# Patient Record
Sex: Male | Born: 1955 | Race: White | Hispanic: No | Marital: Married | Smoking: Never smoker
Health system: Southern US, Community
[De-identification: ages and names within clinical notes are randomized; demographics above are authoritative.]

## PROBLEM LIST (undated history)

## (undated) DIAGNOSIS — M109 Gout, unspecified: Secondary | ICD-10-CM

## (undated) DIAGNOSIS — N183 Chronic kidney disease, stage 3 unspecified: Secondary | ICD-10-CM

## (undated) DIAGNOSIS — K4090 Unilateral inguinal hernia, without obstruction or gangrene, not specified as recurrent: Secondary | ICD-10-CM

## (undated) DIAGNOSIS — F419 Anxiety disorder, unspecified: Secondary | ICD-10-CM

## (undated) DIAGNOSIS — D709 Neutropenia, unspecified: Secondary | ICD-10-CM

## (undated) DIAGNOSIS — D751 Secondary polycythemia: Secondary | ICD-10-CM

## (undated) DIAGNOSIS — C719 Malignant neoplasm of brain, unspecified: Secondary | ICD-10-CM

## (undated) DIAGNOSIS — M1990 Unspecified osteoarthritis, unspecified site: Secondary | ICD-10-CM

## (undated) DIAGNOSIS — G4730 Sleep apnea, unspecified: Secondary | ICD-10-CM

## (undated) DIAGNOSIS — N200 Calculus of kidney: Secondary | ICD-10-CM

## (undated) DIAGNOSIS — Z961 Presence of intraocular lens: Secondary | ICD-10-CM

## (undated) DIAGNOSIS — E663 Overweight: Secondary | ICD-10-CM

## (undated) DIAGNOSIS — I10 Essential (primary) hypertension: Secondary | ICD-10-CM

## (undated) DIAGNOSIS — E786 Lipoprotein deficiency: Secondary | ICD-10-CM

## (undated) DIAGNOSIS — C801 Malignant (primary) neoplasm, unspecified: Secondary | ICD-10-CM

## (undated) DIAGNOSIS — N529 Male erectile dysfunction, unspecified: Secondary | ICD-10-CM

## (undated) DIAGNOSIS — G8929 Other chronic pain: Secondary | ICD-10-CM

## (undated) DIAGNOSIS — Z87442 Personal history of urinary calculi: Secondary | ICD-10-CM

## (undated) DIAGNOSIS — G4700 Insomnia, unspecified: Secondary | ICD-10-CM

## (undated) DIAGNOSIS — T8859XA Other complications of anesthesia, initial encounter: Secondary | ICD-10-CM

## (undated) HISTORY — PX: BRAIN SURGERY: SHX531

## (undated) HISTORY — DX: Chronic kidney disease, stage 3 unspecified: N18.30

## (undated) HISTORY — PX: LITHOTRIPSY: SUR834

## (undated) HISTORY — DX: Insomnia, unspecified: G47.00

## (undated) HISTORY — PX: EYE SURGERY: SHX253

## (undated) HISTORY — DX: Other chronic pain: G89.29

## (undated) HISTORY — PX: COLONOSCOPY: SHX174

## (undated) HISTORY — DX: Essential (primary) hypertension: I10

## (undated) HISTORY — DX: Sleep apnea, unspecified: G47.30

## (undated) HISTORY — DX: Calculus of kidney: N20.0

## (undated) HISTORY — DX: Gout, unspecified: M10.9

## (undated) HISTORY — DX: Overweight: E66.3

## (undated) HISTORY — PX: OTHER SURGICAL HISTORY: SHX169

## (undated) HISTORY — DX: Lipoprotein deficiency: E78.6

## (undated) HISTORY — PX: HERNIA REPAIR: SHX51

## (undated) HISTORY — DX: Male erectile dysfunction, unspecified: N52.9

## (undated) HISTORY — DX: Unilateral inguinal hernia, without obstruction or gangrene, not specified as recurrent: K40.90

---

## 2005-03-19 ENCOUNTER — Ambulatory Visit: Payer: Self-pay | Admitting: Unknown Physician Specialty

## 2005-05-31 ENCOUNTER — Ambulatory Visit: Payer: Self-pay | Admitting: General Practice

## 2005-05-31 IMAGING — CR DG LUMBAR SPINE AP/LAT/OBLIQUES W/ FLEX AND EXT
1 series · 6 of 6 positions shown · non-contrast
Comparison: none

REASON FOR EXAM: Back pain (fax results to Dr.[email or web address] [[email or web address]])
COMMENTS:

PROCEDURE:     DXR - DXR LUMBAR SPINE WITH OBLIQUES  - [DATE]  [DATE]
RESULT:       AP, lateral and oblique views of the lumbar spine show the
vertebral body heights and intervertebral disc spaces to be well maintained.
 The vertebral body alignment is normal.  The pedicles are bilaterally
intact.

[Series 1: view not recorded · 0.17mm/px · 6 of 6 slices shown]
[im 1/6]
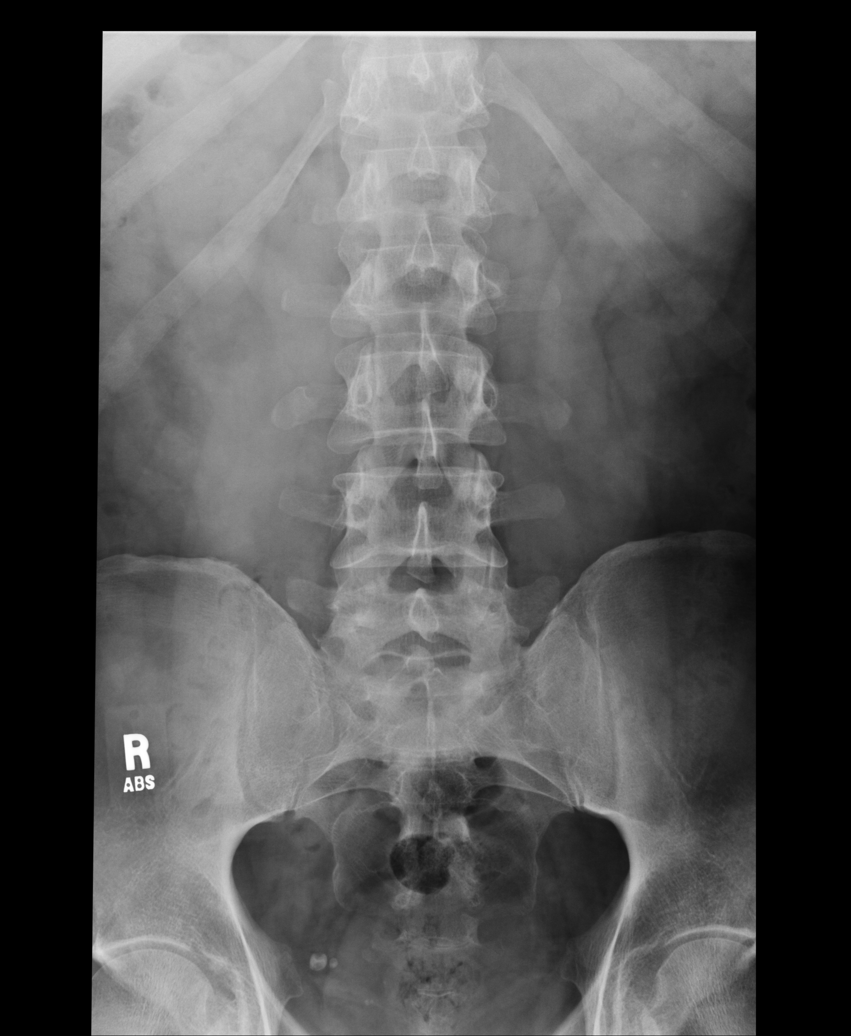
[im 2/6]
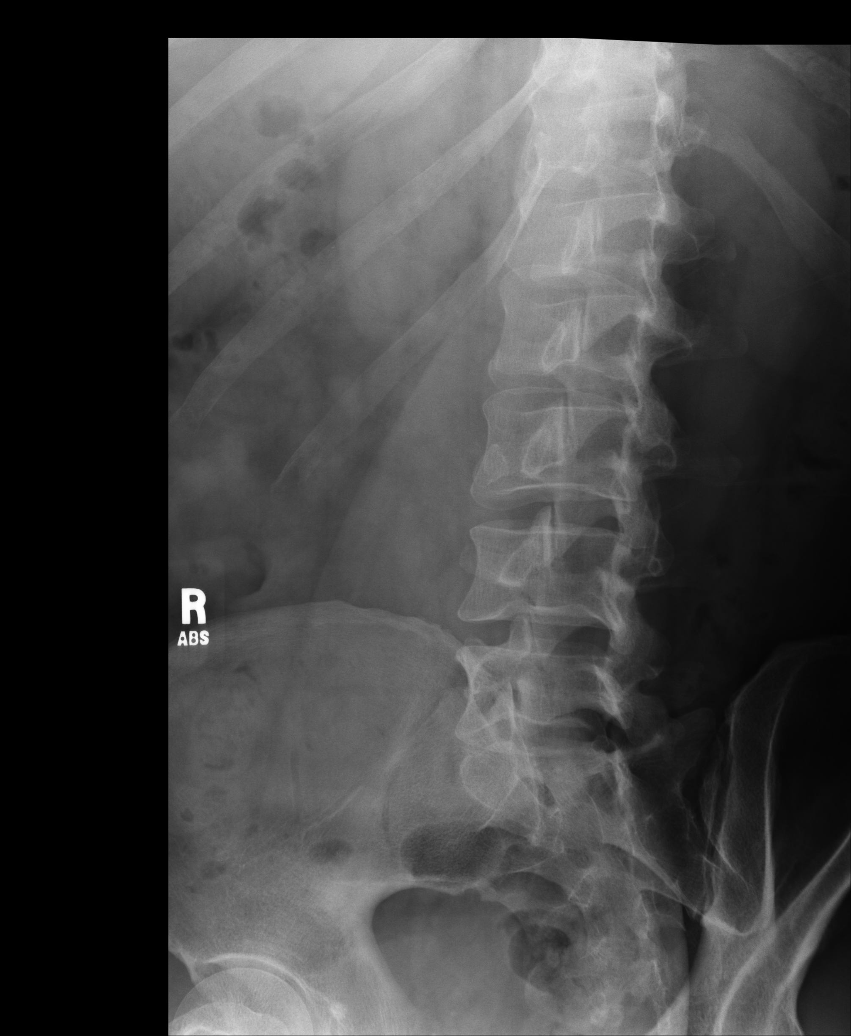
[im 3/6]
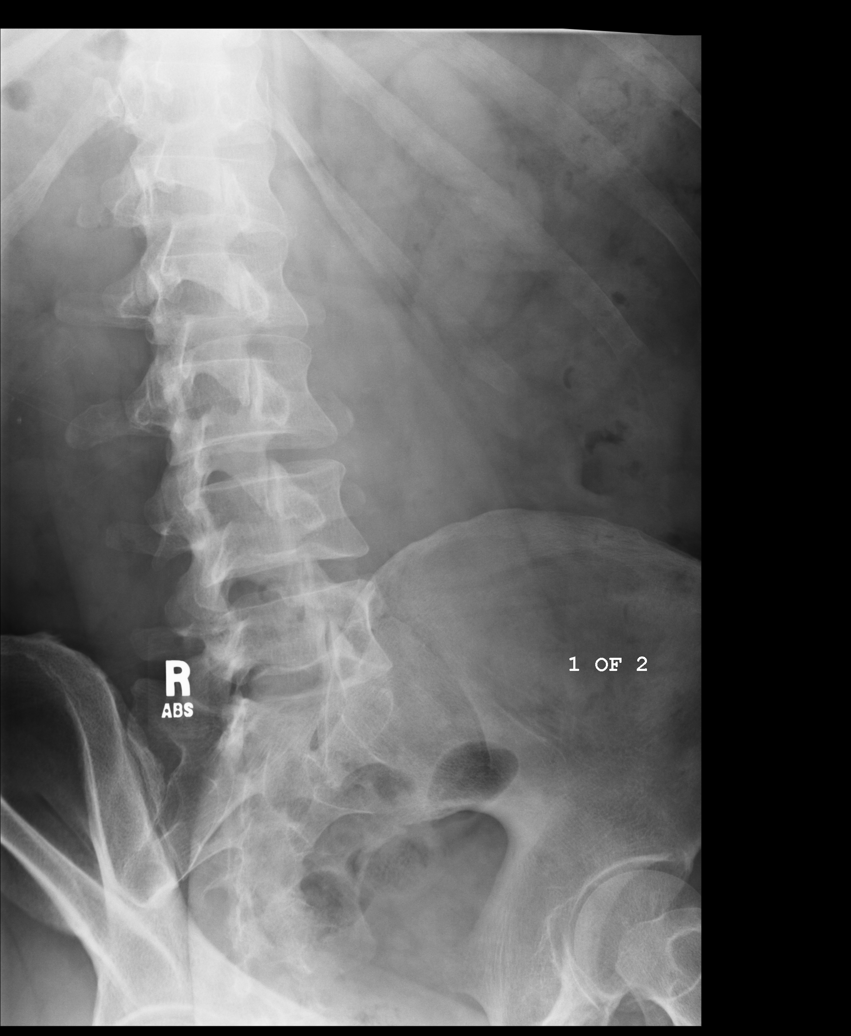
[im 4/6]
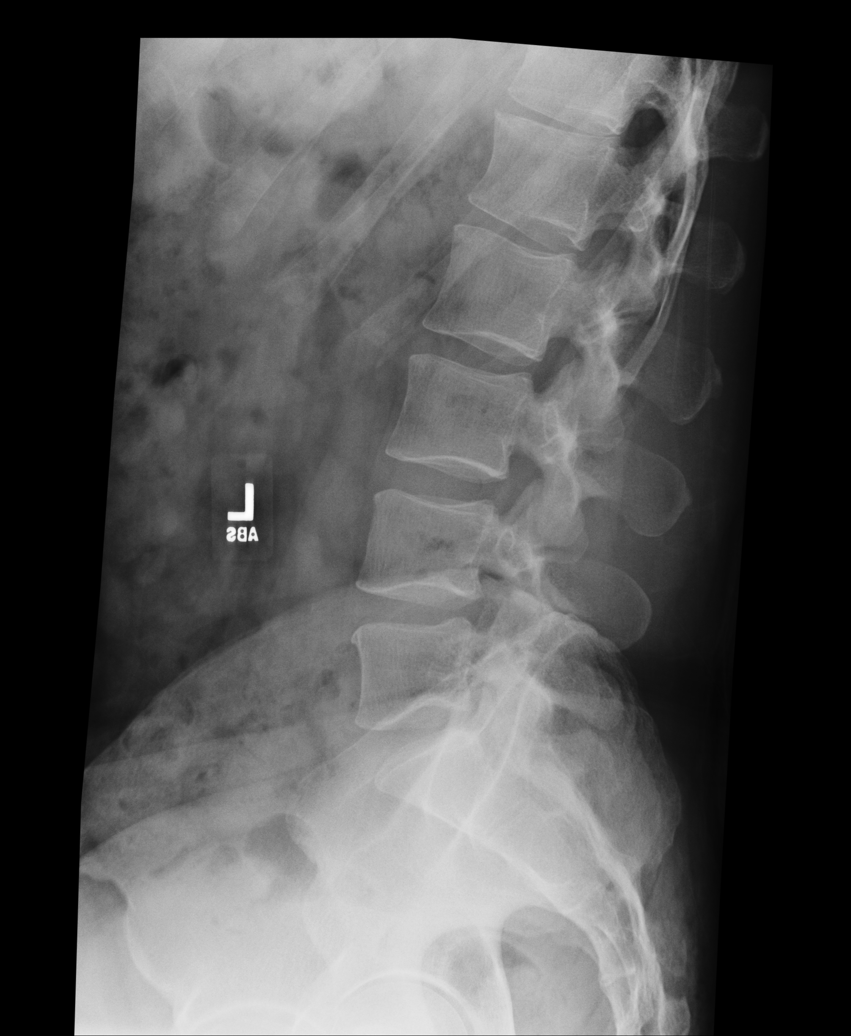
[im 5/6]
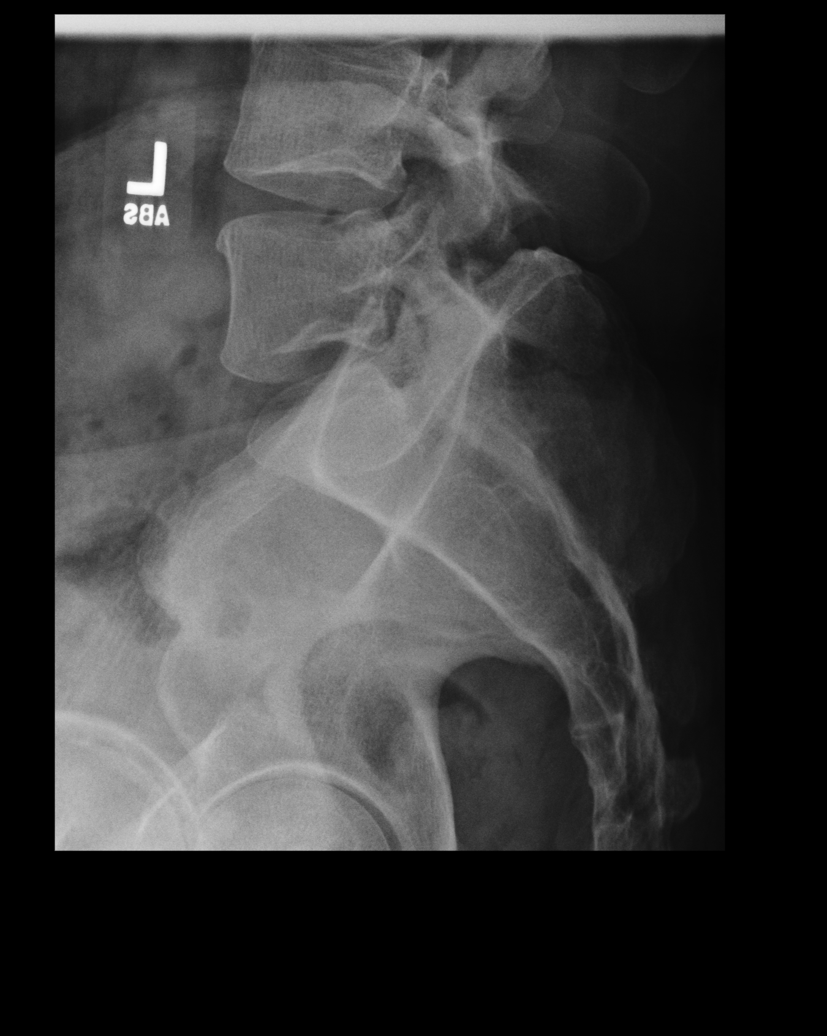
[im 6/6]
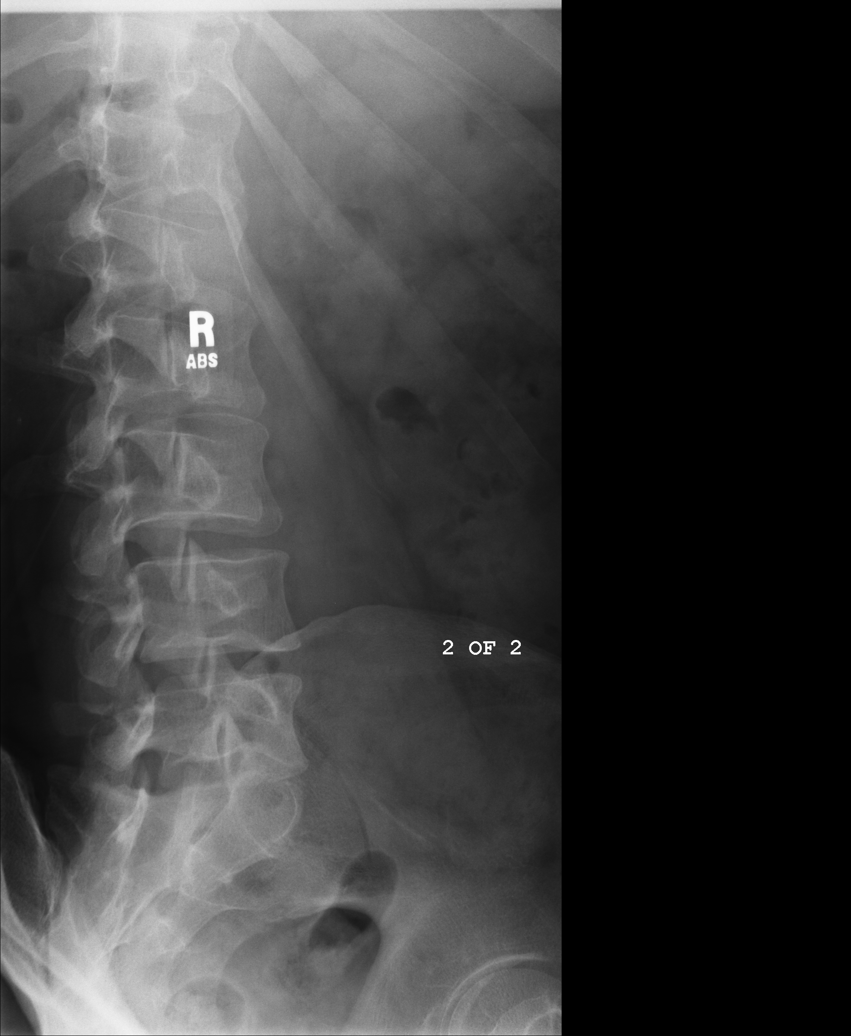

[6 of 6 positions shown; findings below may reference images not displayed]

IMPRESSION: No significant abnormalities are noted.

## 2006-12-12 ENCOUNTER — Ambulatory Visit: Payer: Self-pay | Admitting: Unknown Physician Specialty

## 2012-05-01 ENCOUNTER — Ambulatory Visit: Payer: Self-pay | Admitting: Unknown Physician Specialty

## 2012-10-03 DIAGNOSIS — Z87442 Personal history of urinary calculi: Secondary | ICD-10-CM | POA: Insufficient documentation

## 2012-10-03 DIAGNOSIS — K4090 Unilateral inguinal hernia, without obstruction or gangrene, not specified as recurrent: Secondary | ICD-10-CM | POA: Insufficient documentation

## 2013-05-24 ENCOUNTER — Ambulatory Visit: Payer: Self-pay | Admitting: General Surgery

## 2013-06-14 ENCOUNTER — Ambulatory Visit: Payer: Self-pay | Admitting: Surgery

## 2014-06-08 NOTE — Discharge Summary (Signed)
PATIENT NAME:  Frank, Johnson MR#:  [id number] DATE OF BIRTH:  [birth year]  DATE OF ADMISSION:  06/14/2013 DATE OF DISCHARGE:  06/15/2013  BRIEF HISTORY:  Frank Johnson is a 59 year old gentleman admitted to the hospital with the plan of pursuing an elective robotic-assisted laparoscopic left inguinal hernia. The procedure was performed without difficulty. No significant intraoperative problems. He has severe sleep apnea, so we elected to observe him for several hours following surgery on a phase 3 admission. However, later in the evening, he still had continued abdominal discomfort, mild nausea, and significant sedation. We elected to observe him over the course of the evening to avoid any postoperative problems related to sleep apnea. This morning, he is up, active, tolerating a diet with no complaints. His wounds look good. There is no sign of any infection. He is discharged home today to be followed in the office in 7 to 10 days' time.   DISCHARGE MEDICATIONS:  Include Ambien 10 mg p.o. at bedtime p.r.n., aspirin 81 mg b.i.d., Xanax 1 mg at bedtime p.r.n., indomethacin 25 mg t.i.d. p.r.n., allopurinol 300 mg once a day, meloxicam 15 mg once a day, Percocet 5/325 every 6 hours p.r.n., lisinopril 20 mg p.o. q. day.   FINAL DISCHARGE DIAGNOSIS:  Left inguinal hernia.   SURGERY:  Robotic-assisted laparoscopic left inguinal hernia repair.   ____________________________ Micheline Maze, MD rle:ms D: 06/15/2013 21:14:51 ET T: 06/15/2013 21:42:21 ET JOB#: [id number]  cc: Micheline Maze, MD, <Dictator> Nelda Severe. Burt Ek, [place] MD ELECTRONICALLY SIGNED 06/16/2013 20:46

## 2014-06-08 NOTE — Op Note (Signed)
PATIENT NAME:  Frank Johnson, Frank Johnson MR#:  [id number] DATE OF BIRTH:  Nov 01, 1955  DATE OF PROCEDURE:  06/14/2013  PREOPERATIVE DIAGNOSIS:  Left inguinal hernia.   POSTOPERATIVE DIAGNOSIS:  Left inguinal hernia.  OPERATION PERFORMED:  Robotic-assisted laparoscopic left inguinal hernia repair.   ANESTHESIA:  General.   SURGEON:  Rodena Goldmann, M.D.   PROCEDURE IN DETAIL:  With the patient in the supine position after the induction of appropriate general anesthesia, the patient was prepped with ChloraPrep and draped with sterile towels.  Foley catheter previously had been placed/  The patient was placed in the head down, feet up position with a fairly steep Trendelenburg. The patient was secured to the table appropriately. Supraumbilical incision was made in the standard fashion and carried down bluntly through the subcutaneous tissue. A Veress needle was used to cannulate the peritoneal cavity. CO2 was insufflated to appropriate pressure measurements. When approximately 2.5 liters of CO2 were instilled an 8.5 mm robotic port was inserted into the peritoneal cavity. Intraperitoneal position was confirmed. CO2 was reinsufflated. Two lateral ports, 8.5 mm in size, were inserted under direct vision. The assistant port, 10 mm in size, inserted into the left upper quadrant under direct vision. The robot was brought to the table, docked to the patient, instruments inserted under direct vision and advanced to the site of the hernia. I then moved to the console. The hernia appeared to be quite large and an indirect defect. The peritoneum was taken down as the colon was adherent to the sac. The sigmoid colon was dissected off the hernia defect and then dissection carried along above the defect laterally from the lateral umbilical fold. The peritoneum was taken down and the sac was dissected free from the cord structures. The sac was quite large and tedious dissection was required to expose it and reduce it into the  abdominal cavity. Cooper's ligament was then identified.  Bard 3DMax mesh was brought to the table, inserted into the preperitoneal space, sutured in place using 3-0 Vicryl. The repair appeared to be satisfactory. The peritoneum was then closed over the mesh to separate from the bowel using interlocking V-Loc suture. Repair appeared to be satisfactory. The instruments were removed, robot undocked. The abdomen was desufflated. Skin incisions were closed with 5-0 nylon. The area was infiltrated with 0.25% Marcaine for postoperative pain control. Sterile dressings were applied. The patient was returned to the recovery room, having tolerated the procedure well. Sponge, instrument and needle counts were correct x 2 in the operating room.     ____________________________ Rodena Goldmann III, MD rle:dmm D: 06/14/2013 14:00:31 ET T: 06/14/2013 20:09:24 ET JOB#: [id number]  cc: Micheline Maze, MD, <Dictator> Nelda Severe. Burt Ek, [place] MD ELECTRONICALLY SIGNED 06/15/2013 21:16

## 2014-10-17 ENCOUNTER — Other Ambulatory Visit: Payer: Self-pay | Admitting: Physician Assistant

## 2014-10-31 ENCOUNTER — Other Ambulatory Visit: Payer: Self-pay | Admitting: Physician Assistant

## 2014-11-16 ENCOUNTER — Other Ambulatory Visit: Payer: Self-pay | Admitting: Physician Assistant

## 2014-12-16 ENCOUNTER — Other Ambulatory Visit: Payer: Self-pay | Admitting: Physician Assistant

## 2015-02-14 ENCOUNTER — Other Ambulatory Visit: Payer: Self-pay | Admitting: Physician Assistant

## 2015-04-21 ENCOUNTER — Other Ambulatory Visit: Payer: Self-pay | Admitting: Physician Assistant

## 2015-05-19 ENCOUNTER — Other Ambulatory Visit: Payer: Self-pay | Admitting: Physician Assistant

## 2018-02-15 HISTORY — PX: RETINAL DETACHMENT SURGERY: SHX105

## 2018-03-02 ENCOUNTER — Other Ambulatory Visit: Payer: Self-pay | Admitting: Family Medicine

## 2018-04-03 ENCOUNTER — Other Ambulatory Visit: Payer: Self-pay | Admitting: Family Medicine

## 2018-04-03 NOTE — Telephone Encounter (Signed)
Not a patient of ours.

## 2018-08-31 ENCOUNTER — Other Ambulatory Visit: Payer: Self-pay | Admitting: Internal Medicine

## 2018-09-19 ENCOUNTER — Other Ambulatory Visit: Payer: Self-pay

## 2018-09-19 MED ORDER — LISINOPRIL 20 MG PO TABS: 20.0000 mg | ORAL_TABLET | Freq: Every day | ORAL | 3 refills | Status: DC

## 2018-11-16 ENCOUNTER — Ambulatory Visit: Payer: 59

## 2018-11-16 DIAGNOSIS — Z23 Encounter for immunization: Secondary | ICD-10-CM

## 2018-11-27 ENCOUNTER — Other Ambulatory Visit: Payer: Self-pay | Admitting: Internal Medicine

## 2018-11-29 ENCOUNTER — Other Ambulatory Visit: Payer: Self-pay | Admitting: Registered Nurse

## 2018-11-29 ENCOUNTER — Encounter: Payer: Self-pay | Admitting: Registered Nurse

## 2018-11-29 NOTE — Telephone Encounter (Signed)
Patient last telephone visit with Dr Roxan Hockey for anxiety/insomnia 05/23/2018.  And last refill Jun 2020.  Per Dr Hendrickson's/[place] policy patient to have face to face visits every 90 days for controlled substances.  I last saw patient Feb 02 2018.   Last labs Jul 2019.   Reviewed [place] PMP website 46 Rx all from [facility].  Last ambien XR fill 10/30/2018 Dr Roxan Hockey.  Patient needs appt.  Bridge refill today.

## 2018-12-27 ENCOUNTER — Encounter: Payer: Self-pay | Admitting: Registered Nurse

## 2018-12-27 ENCOUNTER — Telehealth: Payer: Self-pay | Admitting: Registered Nurse

## 2018-12-27 MED ORDER — ZOLPIDEM TARTRATE ER 12.5 MG PO TBCR: EXTENDED_RELEASE_TABLET | ORAL | 0 refills | Status: DC

## 2018-12-27 NOTE — Telephone Encounter (Signed)
Bridge refill given for zolpidem tartrate ER 12.5mg  (1/2-1 tab) po qhs prn insomnia #30 RF0 on 29 Nov 2018 and patient to schedule follow up visit as controlled substances require face to face visit every 3 months per Dr Roxan Hockey.  None noted in Epic since last telephone visit 05/23/2018.  Received request today for another medication refill.  [place] PMP website reviewed last fill 11/29/2018.  All Rx from [facility].  Jess contacting patient to schedule appt.  Earliest available 01/18/2019 with PA Truman Hayward.  Bridge refill entered for patient.

## 2019-01-01 DIAGNOSIS — H524 Presbyopia: Secondary | ICD-10-CM | POA: Diagnosis not present

## 2019-01-01 DIAGNOSIS — H3322 Serous retinal detachment, left eye: Secondary | ICD-10-CM | POA: Diagnosis not present

## 2019-01-03 DIAGNOSIS — H33022 Retinal detachment with multiple breaks, left eye: Secondary | ICD-10-CM | POA: Diagnosis not present

## 2019-01-10 ENCOUNTER — Other Ambulatory Visit: Payer: Self-pay

## 2019-01-10 DIAGNOSIS — Z0000 Encounter for general adult medical examination without abnormal findings: Secondary | ICD-10-CM

## 2019-01-10 LAB — POCT URINALYSIS DIPSTICK
Bilirubin, UA: NEGATIVE
Blood, UA: NEGATIVE
Glucose, UA: NEGATIVE
Ketones, UA: NEGATIVE
Leukocytes, UA: NEGATIVE
Nitrite, UA: NEGATIVE
Protein, UA: NEGATIVE
Spec Grav, UA: 1.025 (ref 1.010–1.025)
Urobilinogen, UA: 0.2 E.U./dL
pH, UA: 5.5 (ref 5.0–8.0)

## 2019-01-11 LAB — CMP12+LP+TP+TSH+6AC+PSA+CBC…
ALT: 55 IU/L — ABNORMAL HIGH (ref 0–44)
AST: 23 IU/L (ref 0–40)
Albumin/Globulin Ratio: 1.9 (ref 1.2–2.2)
Albumin: 4.8 g/dL (ref 3.8–4.8)
Alkaline Phosphatase: 103 IU/L (ref 39–117)
BUN/Creatinine Ratio: 22 (ref 10–24)
BUN: 30 mg/dL — ABNORMAL HIGH (ref 8–27)
Basophils Absolute: 0.2 10*3/uL (ref 0.0–0.2)
Basos: 2 %
Bilirubin Total: 1.3 mg/dL — ABNORMAL HIGH (ref 0.0–1.2)
Calcium: 10.4 mg/dL — ABNORMAL HIGH (ref 8.6–10.2)
Chloride: 103 mmol/L (ref 96–106)
Chol/HDL Ratio: 5.9 ratio — ABNORMAL HIGH (ref 0.0–5.0)
Cholesterol, Total: 202 mg/dL — ABNORMAL HIGH (ref 100–199)
Creatinine, Ser: 1.39 mg/dL — ABNORMAL HIGH (ref 0.76–1.27)
EOS (ABSOLUTE): 0.1 10*3/uL (ref 0.0–0.4)
Eos: 1 %
Estimated CHD Risk: 1.3 times avg. — ABNORMAL HIGH (ref 0.0–1.0)
Free Thyroxine Index: 2.1 (ref 1.2–4.9)
GFR calc Af Amer: 62 mL/min/{1.73_m2} (ref >59)
GFR calc non Af Amer: 54 mL/min/{1.73_m2} — ABNORMAL LOW (ref >59)
GGT: 24 IU/L (ref 0–65)
Globulin, Total: 2.5 g/dL (ref 1.5–4.5)
Glucose: 92 mg/dL (ref 65–99)
HDL: 34 mg/dL — ABNORMAL LOW (ref >39)
Hematocrit: 59.3 % — ABNORMAL HIGH (ref 37.5–51.0)
Hemoglobin: 20.3 g/dL (ref 13.0–17.7)
Immature Grans (Abs): 0.7 10*3/uL — ABNORMAL HIGH (ref 0.0–0.1)
Immature Granulocytes: 5 %
Iron: 144 ug/dL (ref 38–169)
LDH: 147 IU/L (ref 121–224)
LDL Chol Calc (NIH): 140 mg/dL — ABNORMAL HIGH (ref 0–99)
Lymphocytes Absolute: 3.5 10*3/uL — ABNORMAL HIGH (ref 0.7–3.1)
Lymphs: 26 %
MCH: 30 pg (ref 26.6–33.0)
MCHC: 34.2 g/dL (ref 31.5–35.7)
MCV: 88 fL (ref 79–97)
Monocytes Absolute: 1 10*3/uL — ABNORMAL HIGH (ref 0.1–0.9)
Monocytes: 8 %
Neutrophils Absolute: 7.8 10*3/uL — ABNORMAL HIGH (ref 1.4–7.0)
Neutrophils: 58 %
Phosphorus: 3.7 mg/dL (ref 2.8–4.1)
Platelets: 269 10*3/uL (ref 150–450)
Potassium: 4.5 mmol/L (ref 3.5–5.2)
Prostate Specific Ag, Serum: 2 ng/mL (ref 0.0–4.0)
RBC: 6.76 x10E6/uL — ABNORMAL HIGH (ref 4.14–5.80)
RDW: 13.5 % (ref 11.6–15.4)
Sodium: 140 mmol/L (ref 134–144)
T3 Uptake Ratio: 27 % (ref 24–39)
T4, Total: 7.7 ug/dL (ref 4.5–12.0)
TSH: 2.11 u[IU]/mL (ref 0.450–4.500)
Total Protein: 7.3 g/dL (ref 6.0–8.5)
Triglycerides: 154 mg/dL — ABNORMAL HIGH (ref 0–149)
Uric Acid: 7.1 mg/dL (ref 3.7–8.6)
VLDL Cholesterol Cal: 28 mg/dL (ref 5–40)
WBC: 13.3 10*3/uL — ABNORMAL HIGH (ref 3.4–10.8)

## 2019-01-18 ENCOUNTER — Other Ambulatory Visit: Payer: Self-pay

## 2019-01-18 ENCOUNTER — Encounter: Payer: Self-pay | Admitting: Physician Assistant

## 2019-01-18 ENCOUNTER — Ambulatory Visit: Payer: Self-pay | Admitting: Physician Assistant

## 2019-01-18 VITALS — BP 110/68 | HR 89 | Temp 98.0°F | Resp 16 | Ht 72.0 in | Wt 231.0 lb

## 2019-01-18 DIAGNOSIS — Z0000 Encounter for general adult medical examination without abnormal findings: Secondary | ICD-10-CM

## 2019-01-18 NOTE — Progress Notes (Signed)
   Subjective: Annual physical    Patient ID: Male Meath, male    DOB: 1956-02-13, 63 y.o.   MRN: [id number]  HPI Patient presents for annual physical.  Patient voices concern for left inguinal hernia.  Patient stated hernia was surgically repaired 10 to 12 years ago.  Condition return proximally 3 months ago.  Patient states the area of protrusion is small and easily reducible with manual pressure.  Patient denies pain with this complaint.  Patient was seen by urologist 3 months ago and was told to monitor condition.  Return back to urology if condition worsens.  Patient is also requesting refill of Xanax.   Review of Systems Hypertension, gout, anxiety, and depression.    Objective:   Physical Exam Patient appears no acute distress.  HEENT was unremarkable.  Neck was supple without adenopathy or bruits.  Lungs were clear to auscultation heart was regular rate and rhythm.  Abdomen with negative HSM and normoactive bowel sounds.  Soft nontender palpation.  Examination left inguinal area reveals no palpable mass at this time.  Invagination of the scrotum reveals moderate guarding.  Examination of the lower extremities shows no deformity.  Patient has full equal range of motion of upper and lower extremities.  Examination cervical lumbar spine without deformity.  No guarding palpation.  Patient full neck range of motion of the cervical lumbar spine.  Cranial nerves II through XII are grossly intact..       Assessment & Plan: Well exam.  Patient physical exam was remarkable only for guarding with invagination of the left scrotum which is understandable with his history of hernia.  Patient counseled on possibility of hernia becoming incarcerated.  Patient advised continue previous medications and a refill for Xanax will be sent to his pharmacy.

## 2019-01-26 ENCOUNTER — Other Ambulatory Visit: Payer: Self-pay | Admitting: Registered Nurse

## 2019-01-26 DIAGNOSIS — G4700 Insomnia, unspecified: Secondary | ICD-10-CM

## 2019-01-27 ENCOUNTER — Encounter: Payer: Self-pay | Admitting: Registered Nurse

## 2019-01-27 NOTE — Telephone Encounter (Signed)
Reviewed [place] PMP website filled 30 tabs zolpidem CR 12.5mg  1/2-1 tab po qhs prn insomnia 12/27/2018.  Last face to face with PA Tamala Julian 01/18/2019.  30 day supply sent to his pharmacy of choice today refill.    Last labs 01/10/2019 per Epic BUN/Cr/calcium/ALT elevated and GFR 54  No labs for comparison in Epic.  Per epocrates no dose adjustment required for liver/renal impairment with his current findings.  Patient to follow up for next face to face visit by 04/18/2018.

## 2019-01-31 DIAGNOSIS — H33022 Retinal detachment with multiple breaks, left eye: Secondary | ICD-10-CM | POA: Diagnosis not present

## 2019-01-31 DIAGNOSIS — H31092 Other chorioretinal scars, left eye: Secondary | ICD-10-CM | POA: Diagnosis not present

## 2019-02-05 ENCOUNTER — Telehealth: Payer: Self-pay | Admitting: General Practice

## 2019-02-05 NOTE — Telephone Encounter (Signed)
PT states that he was only given 15 pills when he picked up his rx for zolpidem (AMBIEN). He called the pharmacy back and was told that he had to have a PA before it could be filled again.

## 2019-02-07 DIAGNOSIS — H33022 Retinal detachment with multiple breaks, left eye: Secondary | ICD-10-CM | POA: Diagnosis not present

## 2019-02-07 DIAGNOSIS — H31092 Other chorioretinal scars, left eye: Secondary | ICD-10-CM | POA: Diagnosis not present

## 2019-02-13 NOTE — Telephone Encounter (Signed)
Received request from patient that he need prior authorization to get his medication Ambien.  Contacted [employer] at [facility] in [place] assisted me with prior authorization process.  Ambien CR 12.5 mg 1/2 to 1 tab po qq hs prn #30  Insomnia - G47.00  PA approved for 36 months starting today (02/13/2019 - 02/12/2022).  [facility] [phone]) notified that PA approved.  AMD

## 2019-02-21 ENCOUNTER — Other Ambulatory Visit: Payer: Self-pay

## 2019-02-21 DIAGNOSIS — H33022 Retinal detachment with multiple breaks, left eye: Secondary | ICD-10-CM | POA: Diagnosis not present

## 2019-02-21 DIAGNOSIS — F419 Anxiety disorder, unspecified: Secondary | ICD-10-CM

## 2019-02-21 MED ORDER — ALPRAZOLAM 0.5 MG PO TABS: 0.5000 mg | ORAL_TABLET | Freq: Every day | ORAL | 2 refills | Status: DC

## 2019-02-21 NOTE — Telephone Encounter (Signed)
Last office visit with PA Tamala Julian 01/18/2019 for physical.   Labs 01/10/2019  ALT 55 AST 23 and GGT 24  GFR 54 BUN 30 and creatinine 1.39  2019 labs Bun 17 Cr 1.24 GFR 62 ALT 24 AST 17 GGT 18  Repeat CMET nonfasting in 3 months with next face to face visit due to controlled substance Rx require face to face every 3 months. Electronic Rx to his pharmacy of choice alprazolam 0.5mg  po daily #30 RF2  Reviewed [place] PMP website reviewed last fill 01/18/2019 30 tabs alprazolam and zolpidem 02/13/2019 15 tabs.  All 49 rx by [facility] providers in the previous 2 years. Please wish patient a happy birthday!

## 2019-02-26 ENCOUNTER — Other Ambulatory Visit: Payer: Self-pay | Admitting: Registered Nurse

## 2019-02-26 ENCOUNTER — Encounter: Payer: Self-pay | Admitting: Registered Nurse

## 2019-02-26 DIAGNOSIS — F5101 Primary insomnia: Secondary | ICD-10-CM

## 2019-02-26 DIAGNOSIS — H31092 Other chorioretinal scars, left eye: Secondary | ICD-10-CM | POA: Diagnosis not present

## 2019-02-26 DIAGNOSIS — H33022 Retinal detachment with multiple breaks, left eye: Secondary | ICD-10-CM | POA: Diagnosis not present

## 2019-02-26 NOTE — Telephone Encounter (Signed)
[facility] patient.  Reviewed [place] PMP website 50 Rx 5 providers all [facility].  Last filled ambien #15 02/13/2019.  Last office visit 01/18/2019 with PA Tamala Julian.  Face to face visit due to with [facility] provider 04/18/2019.  Labs 01/10/2019 BUN 30, Cr 1.39, ALT 55 AST 23 T Bili 1.3 GGT 24 GFR 54  urinlaysis concentrated but otherwise normal  Per epocrates no adjustment for renal or liver impairment.  PA completed by RN Raenette Rover 02/13/2019 with [employer] good for 36 months expires 02/12/2022 for ambien CR 12.5mg  1/2 to 1 tab po qhs prn insomnia #30 RF0  Dx insomnia G47.0  Electronic Rx sent to his pharmacy of choice ambien CR 12.5mg  sig 1/2-1  po qhs prn insomnia #30 RF0.

## 2019-03-08 DIAGNOSIS — H2513 Age-related nuclear cataract, bilateral: Secondary | ICD-10-CM | POA: Diagnosis not present

## 2019-03-08 DIAGNOSIS — H31092 Other chorioretinal scars, left eye: Secondary | ICD-10-CM | POA: Diagnosis not present

## 2019-03-08 DIAGNOSIS — H25813 Combined forms of age-related cataract, bilateral: Secondary | ICD-10-CM | POA: Insufficient documentation

## 2019-03-08 DIAGNOSIS — H33022 Retinal detachment with multiple breaks, left eye: Secondary | ICD-10-CM | POA: Diagnosis not present

## 2019-03-14 DIAGNOSIS — Z20828 Contact with and (suspected) exposure to other viral communicable diseases: Secondary | ICD-10-CM | POA: Diagnosis not present

## 2019-03-21 ENCOUNTER — Other Ambulatory Visit: Payer: Self-pay | Admitting: Internal Medicine

## 2019-03-21 ENCOUNTER — Encounter: Payer: Self-pay | Admitting: Internal Medicine

## 2019-03-21 DIAGNOSIS — R7989 Other specified abnormal findings of blood chemistry: Secondary | ICD-10-CM

## 2019-03-21 NOTE — Telephone Encounter (Signed)
Patient labs 01/10/2019 uric acid 7.1 GFR 54 BUN 30 Cr 1.39; last office visit with PA Tamala Julian 01/18/2019  Repeat executive panel nonfasting prior to next refill.  Avoid dehydration/drink water to keep urine pale yellow/clear and voiding every 4 hours while awake.  Avoid over the counter medications without first consulting pharmacist/provider.  Electronic Rx sent to his pharmacy of choice allopurinol 300mg  po daily #90 RF0

## 2019-03-25 DIAGNOSIS — H25813 Combined forms of age-related cataract, bilateral: Secondary | ICD-10-CM | POA: Diagnosis not present

## 2019-03-25 DIAGNOSIS — Z20822 Contact with and (suspected) exposure to covid-19: Secondary | ICD-10-CM | POA: Diagnosis not present

## 2019-03-26 ENCOUNTER — Other Ambulatory Visit: Payer: Self-pay | Admitting: Registered Nurse

## 2019-03-26 ENCOUNTER — Encounter: Payer: Self-pay | Admitting: Registered Nurse

## 2019-03-26 NOTE — Telephone Encounter (Signed)
Noted. appt scheduled

## 2019-03-26 NOTE — Telephone Encounter (Signed)
Patient is scheduled for lab and physical. Lab appmt on 05/24/2019

## 2019-03-26 NOTE — Telephone Encounter (Signed)
Last fill 02/26/2019 per [place] PMP website review.  All Rx from [facility] providers.  Last office visit 01/18/2019 with PA Tamala Julian.  Noted patient has cataract procedure scheduled for 03/28/2019 in Epic.  Electronic Rx sent to his pharmacy of choice.  Patient will need face to face visit after 04/18/2019 for controlled substance refills.  Labs completed 01/10/2019 repeat labs due prior to next allopurinol refill noted in chart on 03/21/2019 due to elevated BUN/Cr.  Patient received prior authorization from [facility] 02/13/2019 for 36 months expires 02/12/2022 for ambien CR 12.5mg  sign 1/2 to 1 tab po qhs prn insomnia #30 RF0

## 2019-03-28 DIAGNOSIS — Z961 Presence of intraocular lens: Secondary | ICD-10-CM | POA: Insufficient documentation

## 2019-03-28 DIAGNOSIS — H52221 Regular astigmatism, right eye: Secondary | ICD-10-CM | POA: Diagnosis not present

## 2019-03-28 DIAGNOSIS — H25811 Combined forms of age-related cataract, right eye: Secondary | ICD-10-CM | POA: Diagnosis not present

## 2019-04-23 ENCOUNTER — Other Ambulatory Visit: Payer: Self-pay | Admitting: Registered Nurse

## 2019-04-23 ENCOUNTER — Encounter: Payer: Self-pay | Admitting: Registered Nurse

## 2019-04-23 NOTE — Telephone Encounter (Signed)
[facility] pt

## 2019-04-23 NOTE — Telephone Encounter (Signed)
Last office visit 01/18/2020 with PA Tamala Julian.  Needs face to face for refill.  [place] PMP website reviewed last Rx from me 04/18/2019 alprazolam and 03/26/2019 zolpidem #30.  Labs 01/10/2019 elevated BUN/CR/ALT/t bili and decreased GFR  Noted cataract surgery 03/29/2019 [facility] per epic review.  Please let me know date of patient appt and will bridge refill.

## 2019-04-23 NOTE — Telephone Encounter (Signed)
appt with me pending 05/24/2019 electronic refill sent to patient pharmacy of choice #30 RF0 ambien CR

## 2019-05-17 ENCOUNTER — Other Ambulatory Visit: Payer: Self-pay | Admitting: Registered Nurse

## 2019-05-17 DIAGNOSIS — M1A9XX0 Chronic gout, unspecified, without tophus (tophi): Secondary | ICD-10-CM

## 2019-05-17 NOTE — Telephone Encounter (Signed)
[facility] pt. Thanks!

## 2019-05-18 ENCOUNTER — Other Ambulatory Visit: Payer: Self-pay | Admitting: Registered Nurse

## 2019-05-18 DIAGNOSIS — F419 Anxiety disorder, unspecified: Secondary | ICD-10-CM

## 2019-05-18 NOTE — Telephone Encounter (Signed)
[facility] pt. Thanks!

## 2019-05-23 ENCOUNTER — Other Ambulatory Visit: Payer: Self-pay | Admitting: Registered Nurse

## 2019-05-23 DIAGNOSIS — F5101 Primary insomnia: Secondary | ICD-10-CM

## 2019-05-24 ENCOUNTER — Other Ambulatory Visit: Payer: Self-pay

## 2019-05-24 DIAGNOSIS — E7800 Pure hypercholesterolemia, unspecified: Secondary | ICD-10-CM

## 2019-05-24 NOTE — Telephone Encounter (Signed)
[facility] pt

## 2019-05-24 NOTE — Progress Notes (Signed)
Follow-up labs as requested by Gerarda Fraction, NP-C.  Scheduled to review results on 05/31/19.  AMD

## 2019-05-31 ENCOUNTER — Other Ambulatory Visit: Payer: Self-pay

## 2019-05-31 ENCOUNTER — Encounter: Payer: Self-pay | Admitting: Physician Assistant

## 2019-05-31 ENCOUNTER — Ambulatory Visit: Payer: Self-pay | Admitting: Physician Assistant

## 2019-05-31 VITALS — BP 110/74 | HR 76 | Temp 98.5°F | Resp 16 | Ht 72.0 in | Wt 236.0 lb

## 2019-05-31 DIAGNOSIS — Z0000 Encounter for general adult medical examination without abnormal findings: Secondary | ICD-10-CM

## 2019-05-31 NOTE — Progress Notes (Signed)
   Subjective: 69-month follow-up    Patient ID: Frank Johnson, male    DOB: 1955/08/06, 64 y.o.   MRN: [id number]  HPI Patient presents for 84-month follow-up secondary to abnormal lab results of creatinine/ BUN.  Patient states since last visit he has retired and voices no complaints except for his recurrent inguinal hernia.  Patient state hernia continues to be reducible.  Review of Systems    Cataracts, hypertension, and gout. Objective:   Physical Exam  Deferred      Assessment & Plan:  Resolved elevated creatinine/BUN.  Continue previous medication follow-up as needed.

## 2019-05-31 NOTE — Progress Notes (Signed)
Labs collected on 05/24/19.  AMD

## 2019-06-01 NOTE — Progress Notes (Signed)
Faxed results received from [facility] per RN Raenette Rover  and reviewed by PA Tamala Julian at patient appt this week per epic note

## 2019-08-15 ENCOUNTER — Other Ambulatory Visit: Payer: Self-pay | Admitting: Physician Assistant

## 2019-08-15 DIAGNOSIS — F419 Anxiety disorder, unspecified: Secondary | ICD-10-CM

## 2019-08-15 DIAGNOSIS — F5101 Primary insomnia: Secondary | ICD-10-CM

## 2019-08-22 ENCOUNTER — Other Ambulatory Visit: Payer: Self-pay

## 2019-08-22 DIAGNOSIS — F419 Anxiety disorder, unspecified: Secondary | ICD-10-CM

## 2019-08-22 DIAGNOSIS — F5101 Primary insomnia: Secondary | ICD-10-CM

## 2019-08-23 MED ORDER — ZOLPIDEM TARTRATE ER 12.5 MG PO TBCR: EXTENDED_RELEASE_TABLET | ORAL | 2 refills | Status: DC

## 2019-08-23 MED ORDER — ALPRAZOLAM 0.5 MG PO TABS: 0.5000 mg | ORAL_TABLET | Freq: Every day | ORAL | 2 refills | Status: DC

## 2019-10-18 ENCOUNTER — Other Ambulatory Visit: Payer: Self-pay

## 2019-10-18 DIAGNOSIS — F5101 Primary insomnia: Secondary | ICD-10-CM

## 2019-10-18 DIAGNOSIS — F419 Anxiety disorder, unspecified: Secondary | ICD-10-CM

## 2019-10-18 MED ORDER — ALPRAZOLAM 0.5 MG PO TABS: 0.5000 mg | ORAL_TABLET | Freq: Every day | ORAL | 2 refills | Status: DC

## 2019-10-18 MED ORDER — ZOLPIDEM TARTRATE ER 12.5 MG PO TBCR: EXTENDED_RELEASE_TABLET | ORAL | 2 refills | Status: DC

## 2019-11-19 ENCOUNTER — Other Ambulatory Visit: Payer: Self-pay | Admitting: Physician Assistant

## 2019-11-19 DIAGNOSIS — M1A9XX0 Chronic gout, unspecified, without tophus (tophi): Secondary | ICD-10-CM

## 2019-12-17 ENCOUNTER — Other Ambulatory Visit: Payer: Self-pay | Admitting: Physician Assistant

## 2019-12-25 ENCOUNTER — Other Ambulatory Visit: Payer: Self-pay

## 2019-12-25 DIAGNOSIS — M545 Low back pain, unspecified: Secondary | ICD-10-CM

## 2019-12-25 DIAGNOSIS — G8929 Other chronic pain: Secondary | ICD-10-CM

## 2019-12-25 NOTE — Telephone Encounter (Signed)
Error

## 2020-01-05 ENCOUNTER — Emergency Department: Payer: 59

## 2020-01-05 ENCOUNTER — Encounter: Payer: Self-pay | Admitting: Emergency Medicine

## 2020-01-05 ENCOUNTER — Other Ambulatory Visit: Payer: Self-pay

## 2020-01-05 ENCOUNTER — Emergency Department
Admission: EM | Admit: 2020-01-05 | Discharge: 2020-01-05 | Disposition: A | Discharge status: Home / Self Care | Payer: 59 | Attending: Emergency Medicine | Admitting: Emergency Medicine

## 2020-01-05 DIAGNOSIS — Z20822 Contact with and (suspected) exposure to covid-19: Secondary | ICD-10-CM | POA: Insufficient documentation

## 2020-01-05 DIAGNOSIS — Z7982 Long term (current) use of aspirin: Secondary | ICD-10-CM | POA: Insufficient documentation

## 2020-01-05 DIAGNOSIS — W230XXA Caught, crushed, jammed, or pinched between moving objects, initial encounter: Secondary | ICD-10-CM | POA: Diagnosis not present

## 2020-01-05 DIAGNOSIS — Z79899 Other long term (current) drug therapy: Secondary | ICD-10-CM | POA: Insufficient documentation

## 2020-01-05 DIAGNOSIS — Y9389 Activity, other specified: Secondary | ICD-10-CM | POA: Diagnosis not present

## 2020-01-05 DIAGNOSIS — I129 Hypertensive chronic kidney disease with stage 1 through stage 4 chronic kidney disease, or unspecified chronic kidney disease: Secondary | ICD-10-CM | POA: Insufficient documentation

## 2020-01-05 DIAGNOSIS — S68114A Complete traumatic metacarpophalangeal amputation of right ring finger, initial encounter: Secondary | ICD-10-CM | POA: Insufficient documentation

## 2020-01-05 DIAGNOSIS — N183 Chronic kidney disease, stage 3 unspecified: Secondary | ICD-10-CM | POA: Insufficient documentation

## 2020-01-05 DIAGNOSIS — Y990 Civilian activity done for income or pay: Secondary | ICD-10-CM | POA: Diagnosis not present

## 2020-01-05 DIAGNOSIS — S68119A Complete traumatic metacarpophalangeal amputation of unspecified finger, initial encounter: Secondary | ICD-10-CM

## 2020-01-05 LAB — RESP PANEL BY RT-PCR (FLU A&B, COVID) ARPGX2
Influenza A by PCR: NEGATIVE
Influenza B by PCR: NEGATIVE
SARS Coronavirus 2 by RT PCR: NEGATIVE

## 2020-01-05 IMAGING — DX DG HAND COMPLETE 3+V*R*
3 series · 3 of 3 positions shown · non-contrast
Comparison: None.

CLINICAL DATA: Traumatic amputation of distal ring finger. Initial
encounter.

EXAM:
RIGHT HAND - COMPLETE 3+ VIEW

[hand ap]
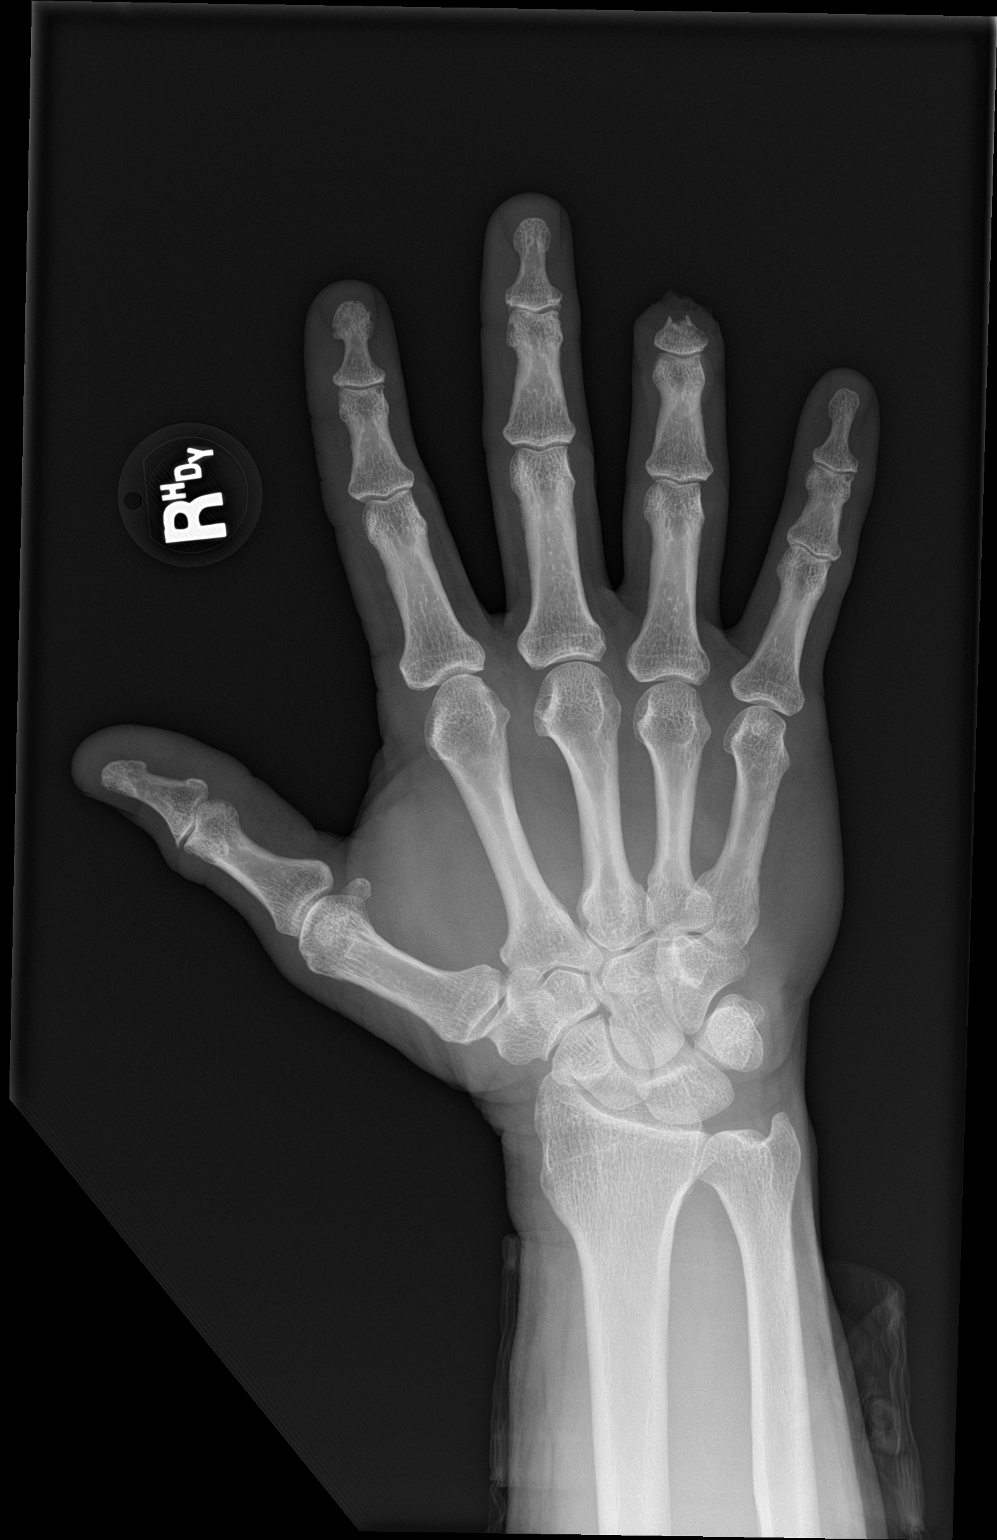

[hand lat]
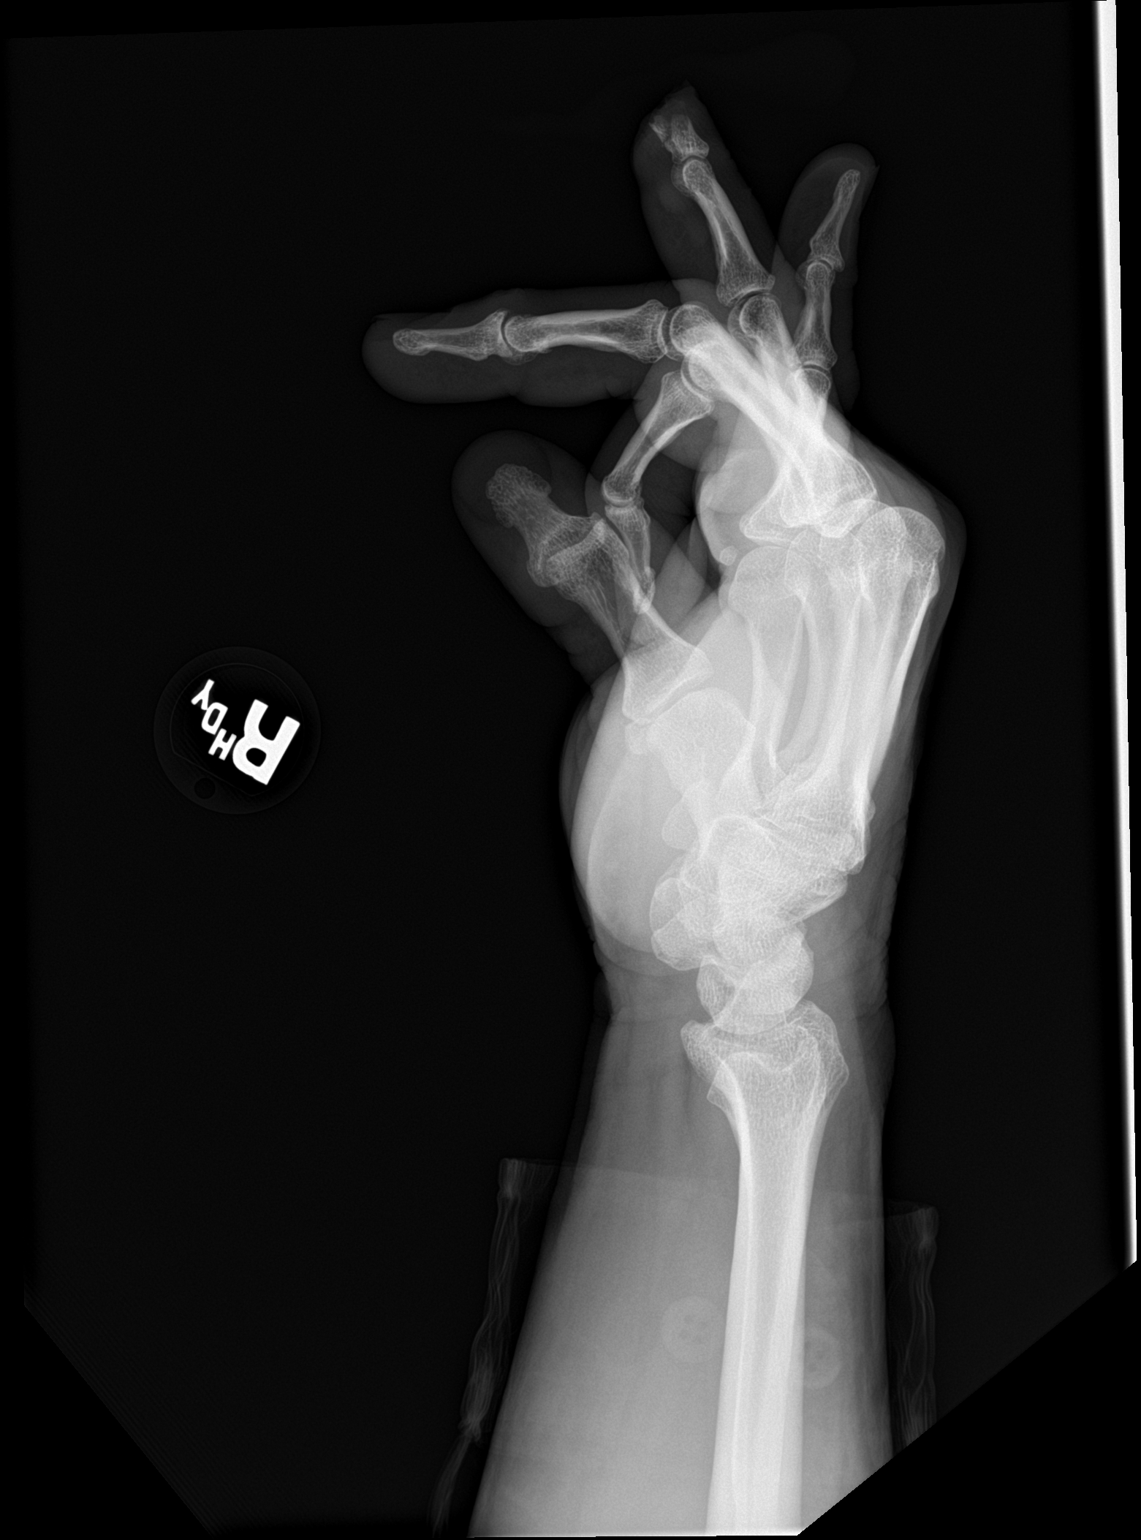

[hand obl]
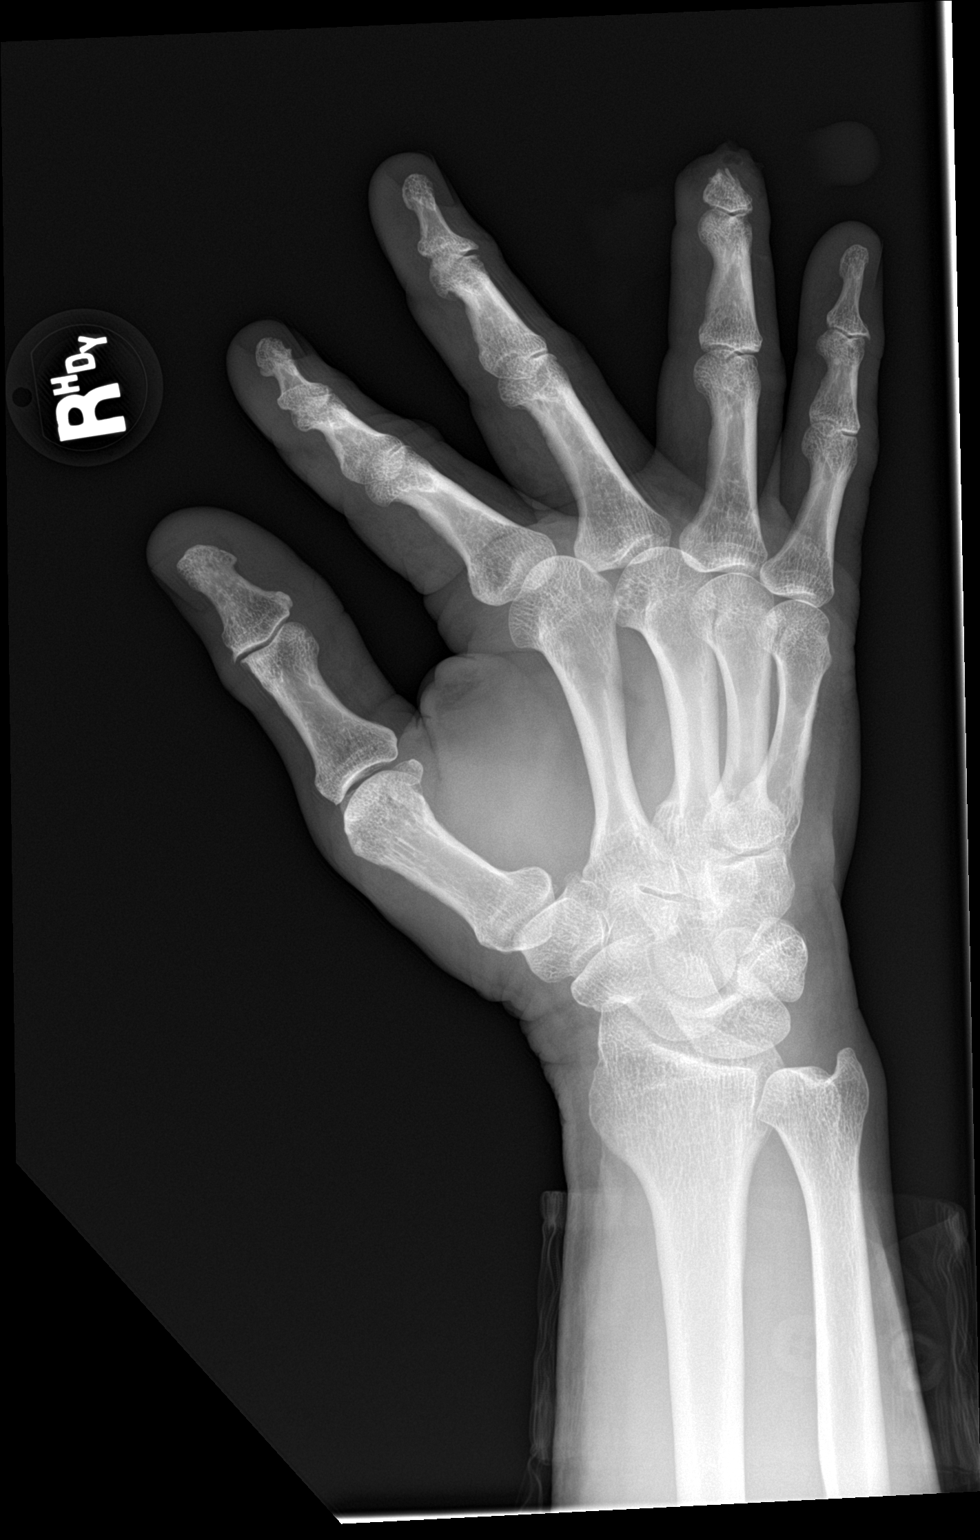

[3 of 3 positions shown; findings below may reference images not displayed]

FINDINGS: Amputation of the distal ring finger is noted with the amputation
line through the proximal aspect of the distal phalanx with small
fracture fragment noted.

No other acute bony abnormality noted.

No subluxation or dislocation identified.
IMPRESSION: Distal ring finger amputation with small fracture fragment.

## 2020-01-05 MED ORDER — OXYCODONE-ACETAMINOPHEN 5-325 MG PO TABS
1.0000 | ORAL_TABLET | ORAL | 0 refills | Status: AC | PRN
Start: 2020-01-05 — End: 2020-01-10

## 2020-01-05 MED ORDER — CEPHALEXIN 500 MG PO CAPS: 500.0000 mg | ORAL_CAPSULE | Freq: Four times a day (QID) | ORAL | 0 refills | Status: AC

## 2020-01-05 MED ORDER — HYDROMORPHONE HCL 1 MG/ML IJ SOLN
1.0000 mg | Freq: Once | INTRAMUSCULAR | Status: AC
  Administered 2020-01-05: 1 mg via INTRAVENOUS
  Filled 2020-01-05: qty 1

## 2020-01-05 MED ORDER — CEFAZOLIN SODIUM-DEXTROSE 2-4 GM/100ML-% IV SOLN
2.0000 g | Freq: Once | INTRAVENOUS | Status: AC
  Administered 2020-01-05: 2 g via INTRAVENOUS
  Filled 2020-01-05 (×2): qty 100

## 2020-01-05 MED ORDER — FENTANYL CITRATE (PF) 100 MCG/2ML IJ SOLN
50.0000 ug | Freq: Once | INTRAMUSCULAR | Status: AC
  Administered 2020-01-05: 50 ug via INTRAVENOUS
  Filled 2020-01-05: qty 2

## 2020-01-05 MED ORDER — LIDOCAINE HCL (PF) 1 % IJ SOLN
10.0000 mL | Freq: Once | INTRAMUSCULAR | Status: AC
  Administered 2020-01-05: 10 mL via INTRADERMAL
  Filled 2020-01-05: qty 10

## 2020-01-05 MED ORDER — LORAZEPAM 2 MG/ML IJ SOLN
1.0000 mg | Freq: Once | INTRAMUSCULAR | Status: AC
  Administered 2020-01-05: 1 mg via INTRAVENOUS
  Filled 2020-01-05: qty 1

## 2020-01-05 MED ORDER — KETOROLAC TROMETHAMINE 30 MG/ML IJ SOLN
30.0000 mg | Freq: Once | INTRAMUSCULAR | Status: AC
  Administered 2020-01-05: 30 mg via INTRAVENOUS
  Filled 2020-01-05: qty 1

## 2020-01-05 NOTE — ED Notes (Signed)
MD at bedside suturing finger

## 2020-01-05 NOTE — ED Notes (Signed)
Patient to ED with finger amputation. Was working with high tinsile wire and the wire caught a drill bit and caused his finger to become amputated.

## 2020-01-05 NOTE — ED Provider Notes (Signed)
[facility] Emergency Department Provider Note ____________________________________________   First MD Initiated Contact with Patient 01/05/20 1137     (approximate)  I have reviewed the triage vital signs and the nursing notes.   HISTORY  Chief Complaint Finger Injury    HPI Frank Johnson is a 64 y.o. male with PMH as noted below who presents with an amputation to the tip of the right fourth finger.  The patient states that he was using a power drill, drilling into a pole that was attached to some wires.  The drill caught, twisted, and caught his fingertip between the handle and one of the wires.  The patient denies any other injuries.   Past Medical History:  Diagnosis Date  . Chronic back pain   . CKD (chronic kidney disease), stage III ([place])   . ED (erectile dysfunction)   . Gout   . HTN (hypertension)   . Insomnia   . Kidney stone   . Left inguinal hernia   . Low HDL (under 40)   . Overweight   . Sleep apnea    Uses every night    Patient Active Problem List   Diagnosis Date Noted  . Pseudophakia of right eye 03/28/2019  . Combined forms of age-related cataract of both eyes 03/08/2019  . Primary insomnia 02/26/2019  . Hernia, inguinal, left 10/03/2012  . History of urinary stone 10/03/2012    Past Surgical History:  Procedure Laterality Date  . RETINAL DETACHMENT SURGERY Left 2020    Prior to Admission medications   Medication Sig Start Date End Date Taking? Authorizing Provider  allopurinol (ZYLOPRIM) 300 MG tablet Take 1 tablet (300 mg total) by mouth daily. 05/17/19  Yes Sable Feil, PA-C  ALPRAZolam Duanne Moron) 0.5 MG tablet Take 1 tablet (0.5 mg total) by mouth daily. 10/18/19  Yes Sable Feil, PA-C  aspirin (ASPIRIN 81) 81 MG EC tablet Take 162 mg by mouth daily.    Yes [provider]  lisinopril (ZESTRIL) 20 MG tablet Take 1 tablet (20 mg total) by mouth daily. 09/19/18  Yes Towanda Malkin, MD    zolpidem (AMBIEN CR) 12.5 MG CR tablet 1/2 tab - 1 tab po hs prn Patient taking differently: Take 6.25-12.5 mg by mouth at bedtime. 1/2 tab - 1 tab po hs prn 10/18/19  Yes Sable Feil, PA-C  cephALEXin (KEFLEX) 500 MG capsule Take 1 capsule (500 mg total) by mouth 4 (four) times daily for 5 days. 01/05/20 01/10/20  Arta Silence, MD  DUREZOL 0.05 % EMUL  05/03/19   [provider]  meloxicam (MOBIC) 15 MG tablet TAKE ONE TABLET EVERY DAY AS NEEDED Patient taking differently: Take 15 mg by mouth daily.  08/31/18   Towanda Malkin, MD  ofloxacin (OCUFLOX) 0.3 % ophthalmic solution Place 1 drop into the left eye 4 (four) times daily. Patient not taking: Reported on 01/05/2020 01/04/19   [provider]  oxyCODONE-acetaminophen (PERCOCET) 5-325 MG tablet Take 1 tablet by mouth every 4 (four) hours as needed for up to 5 days for severe pain. 01/05/20 01/10/20  Arta Silence, MD  pilocarpine (PILOCAR) 1 % ophthalmic solution Place 1 drop into the left eye 3 (three) times daily. Patient not taking: Reported on 01/05/2020 09/28/19   [provider]  prednisoLONE acetate (PRED FORTE) 1 % ophthalmic suspension SMARTSIG:1 Drop(s) Left Eye 8 Times Daily Patient not taking: Reported on 01/05/2020 01/04/19   [provider]  timolol (TIMOPTIC) 0.5 %  ophthalmic solution Apply to eye. Patient not taking: Reported on 01/05/2020 05/29/19 05/28/20  [provider]  timolol (TIMOPTIC) 0.5 % ophthalmic solution 1 drop 2 (two) times daily. Patient not taking: Reported on 01/05/2020 05/29/19   [provider]    Allergies Patient has no known allergies.  No family history on file.  Social History Social History   Tobacco Use  . Smoking status: Never Smoker  . Smokeless tobacco: Never Used  Substance Use Topics  . Alcohol use: Not on file  . Drug use: Not on file    Review of Systems  Constitutional: No fever. Eyes: No  redness. ENT: No sore throat. Cardiovascular: Denies chest pain. Respiratory: Denies shortness of breath. Gastrointestinal: No vomiting. Genitourinary: Negative for dysuria.  Musculoskeletal: Negative for back pain. Skin: Negative for rash. Neurological: Negative for headache.   ____________________________________________   PHYSICAL EXAM:  VITAL SIGNS: ED Triage Vitals  Enc Vitals Group     BP 01/05/20 1056 (!) 146/93     Pulse Rate 01/05/20 1056 78     Resp 01/05/20 1056 18     Temp 01/05/20 1056 98.3 F (36.8 C)     Temp Source 01/05/20 1056 Oral     SpO2 01/05/20 1056 97 %     Weight 01/05/20 1058 235 lb 14.3 oz (107 kg)     Height 01/05/20 1058 6' (1.829 m)     Head Circumference --      Peak Flow --      Pain Score 01/05/20 1057 3     Pain Loc --      Pain Edu? --      Excl. in [place]? --     Constitutional: Alert and oriented. Well appearing and in no acute distress. Eyes: Conjunctivae are normal.  Head: Atraumatic. Nose: No congestion/rhinnorhea. Mouth/Throat: Mucous membranes are moist.   Neck: Normal range of motion.  Cardiovascular: Normal rate, regular rhythm. Good peripheral circulation. Respiratory: Normal respiratory effort.  No retractions.  Gastrointestinal: No distention.  Musculoskeletal: Extremities warm and well perfused.  Complete amputation of the distal aspect of the right fourth digit right at the nail bed.  Full range of motion of the remaining digit.  Mild bleeding. Neurologic:  Normal speech and language. No gross focal neurologic deficits are appreciated.  Skin:  Skin is warm and dry. No rash noted. Psychiatric: Mood and affect are normal. Speech and behavior are normal.  ____________________________________________   LABS (all labs ordered are listed, but only abnormal results are displayed)  Labs Reviewed  RESP PANEL BY RT-PCR (FLU A&B, COVID) ARPGX2    ____________________________________________  EKG   ____________________________________________  RADIOLOGY  XR R hand interpreted by me shows amputation of the distal ring finger distal to the DIP joint  ____________________________________________   PROCEDURES  Procedure(s) performed: Yes  .Marland KitchenLaceration Repair  Date/Time: 01/05/2020 1:37 PM Performed by: Arta Silence, MD Authorized by: Arta Silence, MD   Consent:    Consent obtained:  Verbal   Consent given by:  Patient   Risks discussed:  Infection, pain, retained foreign body, poor cosmetic result, poor wound healing and need for additional repair   Alternatives discussed:  Delayed treatment Anesthesia (see MAR for exact dosages):    Anesthesia method:  Nerve block   Block location:  Digital   Block needle gauge:  25 G   Block anesthetic:  Lidocaine 1% w/o epi   Block outcome:  Anesthesia achieved Laceration details:    Location:  Finger  Finger location:  R ring finger Repair type:    Repair type:  Complex Pre-procedure details:    Preparation:  Patient was prepped and draped in usual sterile fashion and imaging obtained to evaluate for foreign bodies Exploration:    Hemostasis achieved with:  Direct pressure   Wound exploration: entire depth of wound probed and visualized     Contaminated: no   Treatment:    Area cleansed with:  Saline   Amount of cleaning:  Extensive   Irrigation solution:  Sterile saline   Irrigation method:  Syringe   Visualized foreign bodies/material removed: no     Debridement:  Moderate Skin repair:    Repair method:  Sutures   Suture size:  3-0   Suture material:  Nylon   Suture technique:  Simple interrupted   Number of sutures:  7 Approximation:    Approximation:  Close Post-procedure details:    Dressing:  Sterile dressing   Patient tolerance of procedure:  Tolerated well, no immediate complications    Critical Care performed:  No ____________________________________________   INITIAL IMPRESSION / ASSESSMENT AND PLAN / ED COURSE  Pertinent labs & imaging results that were available during my care of the patient were reviewed by me and considered in my medical decision making (see chart for details).  64 year old male with PMH as noted above presents with an amputation to the right fourth digit tip.  He brought the fingertip with him.  However, x-ray confirms that it is distal to the DIP joint, so there is no indication for any attempt at reattachment.  I discussed the case with Dr. Gaspar Bidding from orthopedics who recommends antibiotics, wound closure, and orthopedic follow-up in approximately 1 week.  ----------------------------------------- 1:38 PM on 01/05/2020 -----------------------------------------  I performed debridement and wound closure successfully.  The patient had an episode of anxiety/panic just before I initiated the digital block, however he responded well to a low-dose of Ativan.  He is now receiving the Ancef, and will be stable for discharge.  Return precautions given, and he expresses understanding.  Orthopedic referral has been provided.  I will start the patient on a course of Keflex as per orthopedic recommendations. ____________________________________________   FINAL CLINICAL IMPRESSION(S) / ED DIAGNOSES  Final diagnoses:  Amputation of tip of finger, initial encounter      NEW MEDICATIONS STARTED DURING THIS VISIT:  New Prescriptions   CEPHALEXIN (KEFLEX) 500 MG CAPSULE    Take 1 capsule (500 mg total) by mouth 4 (four) times daily for 5 days.   OXYCODONE-ACETAMINOPHEN (PERCOCET) 5-325 MG TABLET    Take 1 tablet by mouth every 4 (four) hours as needed for up to 5 days for severe pain.     Note:  This document was prepared using Dragon voice recognition software and may include unintentional dictation errors.    Arta Silence, MD 01/05/20 1340

## 2020-01-05 NOTE — Discharge Instructions (Signed)
Follow-up with Dr. Peggye Ley at [facility] in approximately 1 week.  You should call on Monday to make an appointment.  Keep the dressing on until tomorrow.  After this you can replace it with a gauze dressing.  Keep the area clean and dry.  Take the antibiotic as prescribed starting tonight, and finish the full 5-day course.  Return to the ER for new, worsening, or persistent severe pain, recurrent or worsening bleeding, pus drainage, redness going up the finger or arm, fever or chills, or any other new or worsening symptoms that concern you.

## 2020-01-05 NOTE — ED Triage Notes (Signed)
Patient arrives via POV with right ring finger amputation. Tip of finger was brought in plastic bag with tip inside. Patient AOx4 and bleeding is controlled at this time.

## 2020-02-06 ENCOUNTER — Other Ambulatory Visit: Payer: Self-pay

## 2020-02-06 DIAGNOSIS — F5101 Primary insomnia: Secondary | ICD-10-CM

## 2020-02-06 MED ORDER — ZOLPIDEM TARTRATE ER 12.5 MG PO TBCR: EXTENDED_RELEASE_TABLET | ORAL | 2 refills | Status: DC

## 2020-02-13 ENCOUNTER — Other Ambulatory Visit: Payer: Self-pay | Admitting: Physician Assistant

## 2020-02-13 DIAGNOSIS — F419 Anxiety disorder, unspecified: Secondary | ICD-10-CM

## 2020-02-16 DIAGNOSIS — G009 Bacterial meningitis, unspecified: Secondary | ICD-10-CM

## 2020-02-16 HISTORY — DX: Bacterial meningitis, unspecified: G00.9

## 2020-02-20 ENCOUNTER — Telehealth: Payer: Self-pay

## 2020-02-20 ENCOUNTER — Encounter: Payer: Self-pay | Admitting: Nurse Practitioner

## 2020-02-20 ENCOUNTER — Ambulatory Visit: Payer: Self-pay | Admitting: Nurse Practitioner

## 2020-02-20 ENCOUNTER — Other Ambulatory Visit: Payer: Self-pay

## 2020-02-20 VITALS — BP 133/93 | HR 76 | Temp 98.5°F | Resp 14 | Ht 72.0 in | Wt 228.0 lb

## 2020-02-20 DIAGNOSIS — I10 Essential (primary) hypertension: Secondary | ICD-10-CM

## 2020-02-20 DIAGNOSIS — F419 Anxiety disorder, unspecified: Secondary | ICD-10-CM

## 2020-02-20 MED ORDER — LISINOPRIL 20 MG PO TABS: 20.0000 mg | ORAL_TABLET | Freq: Every day | ORAL | 0 refills | Status: AC

## 2020-02-20 NOTE — Progress Notes (Signed)
Pt presents today to medication refill on lisinopril and alprazolam. CL,RMA

## 2020-02-20 NOTE — Progress Notes (Signed)
   Subjective:    Patient ID: Frank Johnson, male    DOB: [birth year], 65 y.o.   MRN: [id number]  HPI  65 year old male presenting for medication refills today  No complaint presented today  Review of Systems Hx of elevated urinary calcium with htn renal stone  Current Outpatient Medications  Medication Instructions  . allopurinol (ZYLOPRIM) 300 mg, Oral, Daily  . ALPRAZolam (XANAX) 0.5 mg, Oral, Daily  . aspirin 162 mg, Oral, Daily  . DUREZOL 0.05 % EMUL No dose, route, or frequency recorded.  Marland Kitchen lisinopril (ZESTRIL) 20 mg, Oral, Daily  . meloxicam (MOBIC) 15 MG tablet TAKE ONE TABLET EVERY DAY AS NEEDED  . ofloxacin (OCUFLOX) 0.3 % ophthalmic solution 1 drop, Left Eye, 4 times daily  . pilocarpine (PILOCAR) 1 % ophthalmic solution 1 drop, Left Eye, 3 times daily  . prednisoLONE acetate (PRED FORTE) 1 % ophthalmic suspension SMARTSIG:1 Drop(s) Left Eye 8 Times Daily  . timolol (TIMOPTIC) 0.5 % ophthalmic solution Ophthalmic  . timolol (TIMOPTIC) 0.5 % ophthalmic solution 1 drop, 2 times daily  . zolpidem (AMBIEN CR) 12.5 MG CR tablet 1/2 tab - 1 tab po hs prn   Today's Vitals   02/20/20 1050  BP: (!) 133/93  Pulse: 76  Resp: 14  Temp: 98.5 F (36.9 C)  SpO2: 97%  Weight: 228 lb (103.4 kg)  Height: 6' (1.829 m)   Body mass index is 30.92 kg/m.    Objective:   Physical Exam unremarkable and without change from prior visit        Assessment & Plan:  Patient is retired, will be turning 65 in 4 days and transitioning to Medicare PCP has appointment in two months Will need review of kdny function, hypercalcuria  Vitd level Patient was seen by Dr. Fran Lowes today  Will refill  Meds for march PMD transition

## 2020-02-20 NOTE — Telephone Encounter (Signed)
Rx refill request already in Epic - re-routed to Ron Smith, PA-C.  AMD 

## 2020-02-22 ENCOUNTER — Other Ambulatory Visit: Payer: Self-pay

## 2020-02-22 NOTE — Telephone Encounter (Signed)
Rx refill request already in Epic - re-routed to Ron Smith, PA-C.  AMD 

## 2020-03-11 ENCOUNTER — Other Ambulatory Visit: Payer: Self-pay | Admitting: Nurse Practitioner

## 2020-03-11 DIAGNOSIS — I10 Essential (primary) hypertension: Secondary | ICD-10-CM

## 2020-03-29 ENCOUNTER — Encounter: Payer: Self-pay | Admitting: Emergency Medicine

## 2020-03-29 ENCOUNTER — Emergency Department: Payer: Medicare Other

## 2020-03-29 ENCOUNTER — Other Ambulatory Visit: Payer: Self-pay

## 2020-03-29 ENCOUNTER — Emergency Department
Admission: EM | Admit: 2020-03-29 | Discharge: 2020-03-29 | Disposition: A | Discharge status: Home / Self Care | Payer: Medicare Other | Attending: Emergency Medicine | Admitting: Emergency Medicine

## 2020-03-29 DIAGNOSIS — R059 Cough, unspecified: Secondary | ICD-10-CM | POA: Diagnosis present

## 2020-03-29 DIAGNOSIS — N183 Chronic kidney disease, stage 3 unspecified: Secondary | ICD-10-CM | POA: Diagnosis not present

## 2020-03-29 DIAGNOSIS — Z79899 Other long term (current) drug therapy: Secondary | ICD-10-CM | POA: Insufficient documentation

## 2020-03-29 DIAGNOSIS — J069 Acute upper respiratory infection, unspecified: Secondary | ICD-10-CM | POA: Diagnosis not present

## 2020-03-29 DIAGNOSIS — I129 Hypertensive chronic kidney disease with stage 1 through stage 4 chronic kidney disease, or unspecified chronic kidney disease: Secondary | ICD-10-CM | POA: Insufficient documentation

## 2020-03-29 DIAGNOSIS — U071 COVID-19: Secondary | ICD-10-CM | POA: Insufficient documentation

## 2020-03-29 DIAGNOSIS — J029 Acute pharyngitis, unspecified: Secondary | ICD-10-CM

## 2020-03-29 LAB — RESP PANEL BY RT-PCR (FLU A&B, COVID) ARPGX2
Influenza A by PCR: NEGATIVE
Influenza B by PCR: NEGATIVE
SARS Coronavirus 2 by RT PCR: POSITIVE — AB

## 2020-03-29 LAB — GROUP A STREP BY PCR: Group A Strep by PCR: NOT DETECTED

## 2020-03-29 IMAGING — DX DG CHEST 1V PORT
1 series · 1 of 1 positions shown · non-contrast
Comparison: None.

CLINICAL DATA: Cough and congestion

EXAM:
PORTABLE CHEST 1 VIEW

[chest ap]
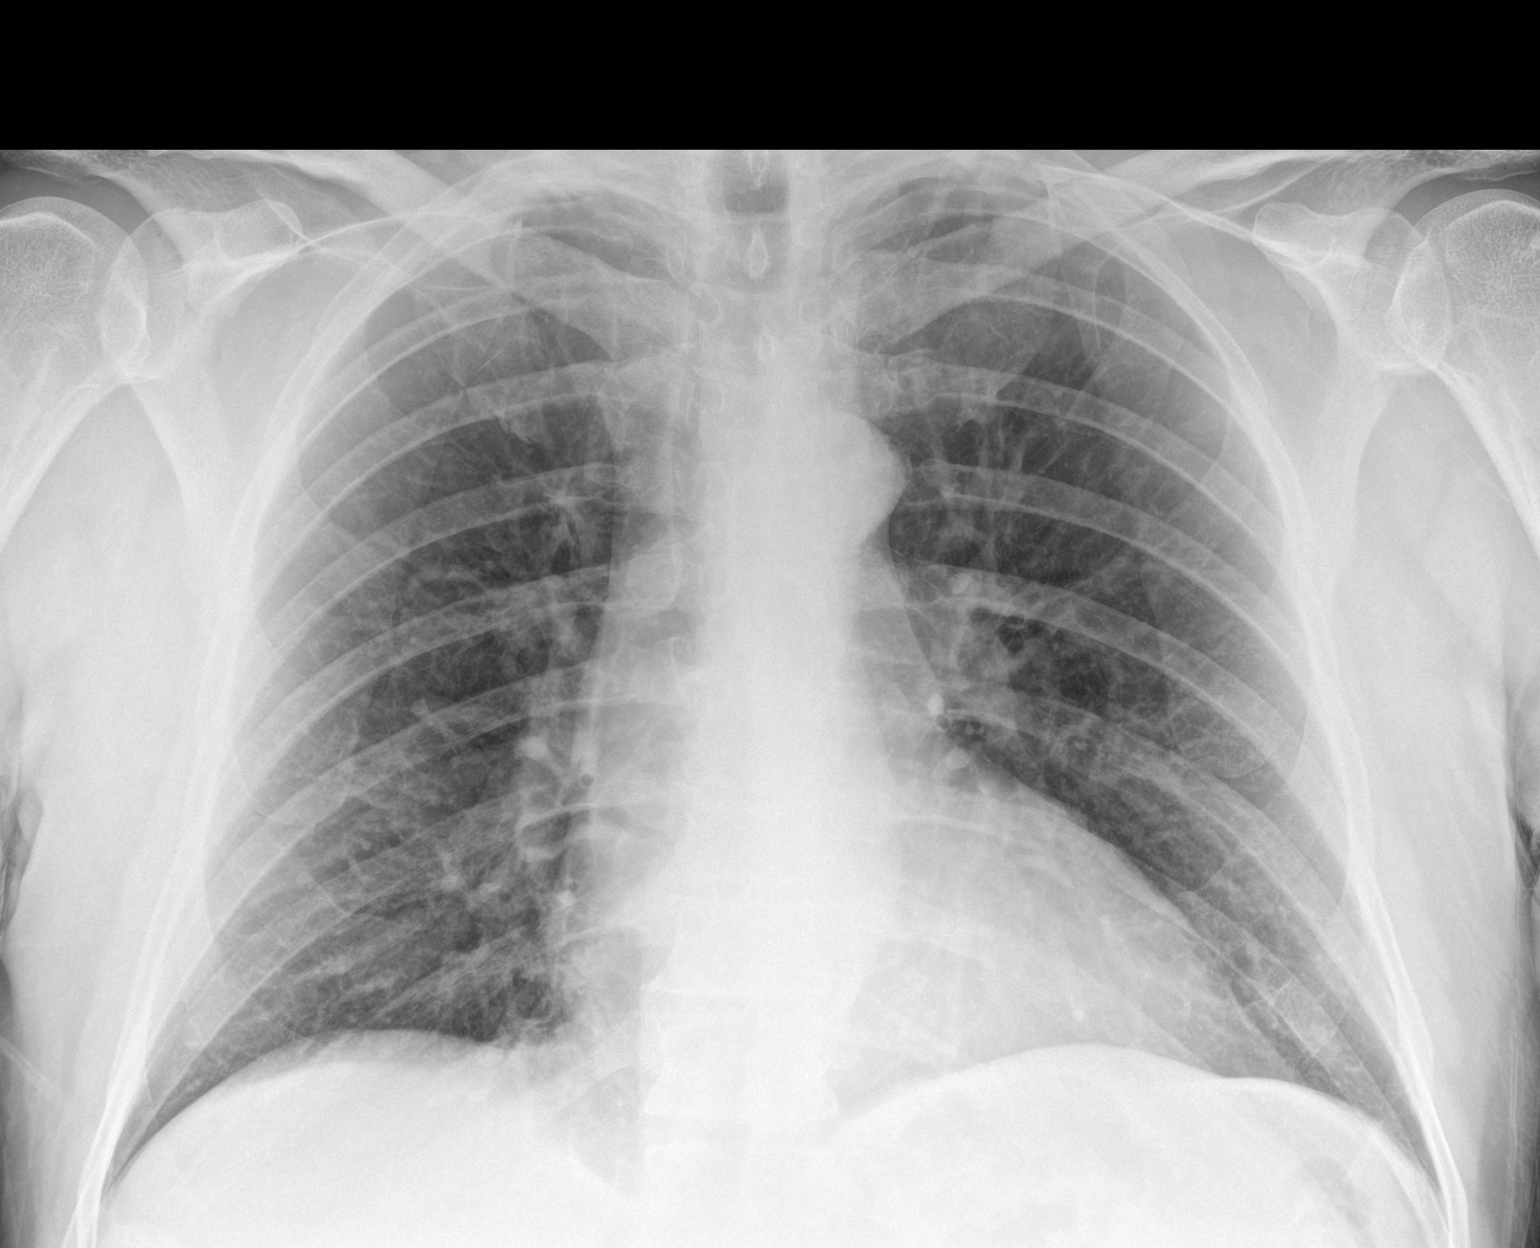

[1 of 1 positions shown; findings below may reference images not displayed]

FINDINGS: Lungs are clear. Heart size and pulmonary vascularity are normal. No
adenopathy. No bone lesions.
IMPRESSION: Lungs clear.  Cardiac silhouette normal.

## 2020-03-29 MED ORDER — PSEUDOEPH-BROMPHEN-DM 30-2-10 MG/5ML PO SYRP: 5.0000 mL | ORAL_SOLUTION | Freq: Four times a day (QID) | ORAL | 0 refills | Status: DC | PRN

## 2020-03-29 MED ORDER — METHYLPREDNISOLONE 4 MG PO TBPK: ORAL_TABLET | ORAL | 0 refills | Status: DC

## 2020-03-29 MED ORDER — LIDOCAINE VISCOUS HCL 2 % MT SOLN
15.0000 mL | Freq: Once | OROMUCOSAL | Status: AC
  Administered 2020-03-29: 15 mL via OROMUCOSAL
  Filled 2020-03-29: qty 15

## 2020-03-29 MED ORDER — DEXAMETHASONE SODIUM PHOSPHATE 10 MG/ML IJ SOLN
10.0000 mg | Freq: Once | INTRAMUSCULAR | Status: AC
  Administered 2020-03-29: 10 mg via INTRAMUSCULAR
  Filled 2020-03-29: qty 1

## 2020-03-29 MED ORDER — LIDOCAINE VISCOUS HCL 2 % MT SOLN: 5.0000 mL | Freq: Four times a day (QID) | OROMUCOSAL | 0 refills | Status: DC | PRN

## 2020-03-29 MED ORDER — DIPHENHYDRAMINE HCL 12.5 MG/5ML PO ELIX
25.0000 mg | ORAL_SOLUTION | Freq: Once | ORAL | Status: AC
  Administered 2020-03-29: 25 mg via ORAL
  Filled 2020-03-29: qty 10

## 2020-03-29 NOTE — ED Notes (Signed)
X-ray at bedside

## 2020-03-29 NOTE — Discharge Instructions (Addendum)
Your strep test is negative.  Your chest x-ray was unremarkable.  Your Covid 19 test is positive.  Follow-up quarantine recommendations and take medications as directed.  Follow-up with PCP.

## 2020-03-29 NOTE — ED Triage Notes (Signed)
Pt in w/sore throat x 2 nights, HA, chills. States this is the worst throat pain of his life, painful to swallow. Temp 98.4, sats 98% RA in triage. Denies any sick contacts

## 2020-03-29 NOTE — ED Provider Notes (Addendum)
[facility] Emergency Department Provider Note   ____________________________________________   Event Date/Time   First MD Initiated Contact with Patient 03/29/20 [phone]     (approximate)  I have reviewed the triage vital signs and the nursing notes.   HISTORY  Chief Complaint Sore Throat and Chills    HPI Frank Johnson is a 65 y.o. male patient presents with sore throat for 2 nights.  Patient does complain of headaches, body aches, chills.  Patient also complained of chest congestion and cough.  Patient states the worst sore throat he ever head.  Increased with swallowing.  Denies recent travel or known contact with COVID-19.  Patient has taken the COVID-19 vaccine but not the booster.  Patient is not taking flu shot for this season.  No palliative measure for complaint.         Past Medical History:  Diagnosis Date  . Chronic back pain   . CKD (chronic kidney disease), stage III ([place])   . ED (erectile dysfunction)   . Gout   . HTN (hypertension)   . Insomnia   . Kidney stone   . Left inguinal hernia   . Low HDL (under 40)   . Overweight   . Sleep apnea    Uses every night    Patient Active Problem List   Diagnosis Date Noted  . Pseudophakia of right eye 03/28/2019  . Combined forms of age-related cataract of both eyes 03/08/2019  . Primary insomnia 02/26/2019  . Hernia, inguinal, left 10/03/2012  . History of urinary stone 10/03/2012    Past Surgical History:  Procedure Laterality Date  . RETINAL DETACHMENT SURGERY Left 2020    Prior to Admission medications   Medication Sig Start Date End Date Taking? Authorizing Provider  brompheniramine-pseudoephedrine-DM 30-2-10 MG/5ML syrup Take 5 mLs by mouth 4 (four) times daily as needed. Mix with 5 mL of viscous lidocaine for swish and swallow. 03/29/20  Yes Sable Feil, PA-C  lidocaine (XYLOCAINE) 2 % solution Use as directed 5 mLs in the mouth or throat every 6 (six) hours as  needed for mouth pain. Mix with 5 mL of Bromfed-DM for swish and swallow. 03/29/20  Yes Sable Feil, PA-C  methylPREDNISolone (MEDROL DOSEPAK) 4 MG TBPK tablet Take Tapered dose as directed 03/29/20  Yes Sable Feil, PA-C  allopurinol (ZYLOPRIM) 300 MG tablet Take 1 tablet (300 mg total) by mouth daily. 05/17/19   Sable Feil, PA-C  ALPRAZolam Duanne Moron) 0.5 MG tablet TAKE 1 TABLET BY MOUTH DAILY. 02/22/20   Sable Feil, PA-C  aspirin 81 MG EC tablet Take 162 mg by mouth daily.     [provider]  DUREZOL 0.05 % EMUL  05/03/19   [provider]  lisinopril (ZESTRIL) 20 MG tablet Take 1 tablet (20 mg total) by mouth daily. 02/20/20   Apolonio Schneiders, FNP  meloxicam (MOBIC) 15 MG tablet TAKE ONE TABLET EVERY DAY AS NEEDED Patient taking differently: Take 15 mg by mouth daily. 08/31/18   Towanda Malkin, MD  ofloxacin (OCUFLOX) 0.3 % ophthalmic solution Place 1 drop into the left eye 4 (four) times daily. 01/04/19   [provider]  pilocarpine (PILOCAR) 1 % ophthalmic solution Place 1 drop into the left eye 3 (three) times daily. 09/28/19   [provider]  prednisoLONE acetate (PRED FORTE) 1 % ophthalmic suspension SMARTSIG:1 Drop(s) Left Eye 8 Times Daily 01/04/19   [provider]  timolol (TIMOPTIC) 0.5 % ophthalmic  solution Apply to eye. 05/29/19 05/28/20  [provider]  timolol (TIMOPTIC) 0.5 % ophthalmic solution 1 drop 2 (two) times daily. Patient not taking: No sig reported 05/29/19   [provider]  zolpidem (AMBIEN CR) 12.5 MG CR tablet 1/2 tab - 1 tab po hs prn 02/06/20   Sable Feil, PA-C    Allergies Patient has no known allergies.  No family history on file.  Social History Social History   Tobacco Use  . Smoking status: Never Smoker  . Smokeless tobacco: Never Used    Review of Systems Constitutional: Chills and body aches.  Eyes: No visual changes. ENT: Sore throat.  Nasal  congestion. Cardiovascular: Denies chest pain. Respiratory: Denies shortness of breath.  Nonproductive cough. Gastrointestinal: No abdominal pain.  No nausea, no vomiting.  No diarrhea.  No constipation. Genitourinary: Negative for dysuria. Musculoskeletal: Negative for back pain. Skin: Negative for rash. Neurological: Positive for headaches, but denies focal weakness or numbness. Endocrine:  Gout and hypertension.  ____________________________________________   PHYSICAL EXAM:  VITAL SIGNS: ED Triage Vitals  Enc Vitals Group     BP 03/29/20 0713 132/84     Pulse Rate 03/29/20 0713 (!) 101     Resp 03/29/20 0713 18     Temp 03/29/20 0713 98.4 F (36.9 C)     Temp Source 03/29/20 0713 Oral     SpO2 03/29/20 0713 98 %     Weight 03/29/20 0714 230 lb (104.3 kg)     Height --      Head Circumference --      Peak Flow --      Pain Score --      Pain Loc --      Pain Edu? --      Excl. in [place]? --    Constitutional: Alert and oriented. Well appearing and in no acute distress. Eyes: Conjunctivae are normal. PERRL. EOMI. Head: Atraumatic. Nose: Edematous nasal turbinates. Mouth/Throat: Mucous membranes are moist.  Nasal drainage.  Oropharynx erythematous. Neck: No stridor. Hematological/Lymphatic/Immunilogical: No cervical lymphadenopathy. Cardiovascular: Normal rate, regular rhythm. Grossly normal heart sounds.  Good peripheral circulation. Respiratory: Normal respiratory effort.  No retractions. Lungs CTAB. Neurologic:  Normal speech and language. No gross focal neurologic deficits are appreciated. No gait instability. Skin:  Skin is warm, dry and intact. No rash noted. Psychiatric: Mood and affect are normal. Speech and behavior are normal.  ____________________________________________   LABS (all labs ordered are listed, but only abnormal results are displayed)  Labs Reviewed  GROUP A STREP BY PCR  RESP PANEL BY RT-PCR (FLU A&B, COVID) ARPGX2    ____________________________________________  EKG   ____________________________________________  RADIOLOGY I, Sable Feil, personally viewed and evaluated these images (plain radiographs) as part of my medical decision making, as well as reviewing the written report by the radiologist.  ED MD interpretation:    Official radiology report(s): DG Chest Portable 1 View  Result Date: 03/29/2020 CLINICAL DATA:  Cough and congestion EXAM: PORTABLE CHEST 1 VIEW COMPARISON:  None. FINDINGS: Lungs are clear. Heart size and pulmonary vascularity are normal. No adenopathy. No bone lesions. IMPRESSION: Lungs clear.  Cardiac silhouette normal. Electronically Signed   By: Lowella Grip III M.D.   On: 03/29/2020 07:56    ____________________________________________   PROCEDURES  Procedure(s) performed (including Critical Care):  Procedures   ____________________________________________   INITIAL IMPRESSION / ASSESSMENT AND PLAN / ED COURSE  As part of my medical decision making, I reviewed the following data  within the [place]         Patient presents with 2 days of headache, chills, body aches, and sore throat. .  Patient's rapid strep test was negative.  Chest x-ray was unremarkable.  Patient is positive for COVID-19.  Patient given discharge care instructions for viral pharyngitis and upper respiratory infection.  Patient advised to quarantine per CDC recommendations for positive COVID-19.     ____________________________________________   FINAL CLINICAL IMPRESSION(S) / ED DIAGNOSES  Final diagnoses:  Viral pharyngitis  Viral upper respiratory tract infection     ED Discharge Orders         Ordered    brompheniramine-pseudoephedrine-DM 30-2-10 MG/5ML syrup  4 times daily PRN        03/29/20 0833    lidocaine (XYLOCAINE) 2 % solution  Every 6 hours PRN        03/29/20 0833    methylPREDNISolone (MEDROL DOSEPAK) 4 MG TBPK tablet         03/29/20 5790          *Please note:  Frank Johnson was evaluated in Emergency Department on 03/29/2020 for the symptoms described in the history of present illness. He was evaluated in the context of the global COVID-19 pandemic, which necessitated consideration that the patient might be at risk for infection with the SARS-CoV-2 virus that causes COVID-19. Institutional protocols and algorithms that pertain to the evaluation of patients at risk for COVID-19 are in a state of rapid change based on information released by regulatory bodies including the CDC and federal and state organizations. These policies and algorithms were followed during the patient's care in the ED.  Some ED evaluations and interventions may be delayed as a result of limited staffing during and the pandemic.*   Note:  This document was prepared using Dragon voice recognition software and may include unintentional dictation errors.    Sable Feil, PA-C 03/29/20 0837    Sable Feil, PA-C 03/29/20 3833    Vanessa , MD 03/29/20 [phone]

## 2020-03-29 NOTE — ED Notes (Signed)
Pt states he has had a sore throat for a couple days with coughing spells and body aches- pt denies fevers at home- pt states this is the worst sore throat he has ever had

## 2020-03-30 ENCOUNTER — Telehealth: Payer: Self-pay | Admitting: Unknown Physician Specialty

## 2020-03-30 NOTE — Telephone Encounter (Signed)
Called to discuss with patient about COVID-19 symptoms and the use of one of the available treatments for those with mild to moderate Covid symptoms and at a high risk of hospitalization.  Pt appears to qualify for outpatient treatment due to co-morbid conditions and/or a member of an at-risk group in accordance with the FDA Emergency Use Authorization.    Symptom onset: 2/10 Vaccinated: ?   Unable to reach pt - [place]

## 2020-03-31 ENCOUNTER — Other Ambulatory Visit (HOSPITAL_COMMUNITY): Payer: Self-pay | Admitting: Adult Health

## 2020-03-31 ENCOUNTER — Ambulatory Visit (HOSPITAL_COMMUNITY)
Admission: RE | Admit: 2020-03-31 | Discharge: 2020-03-31 | Disposition: A | Discharge status: Home / Self Care | Payer: Medicare Other | Source: Ambulatory Visit | Attending: Pulmonary Disease | Admitting: Pulmonary Disease

## 2020-03-31 ENCOUNTER — Encounter: Payer: Self-pay | Admitting: Adult Health

## 2020-03-31 DIAGNOSIS — U071 COVID-19: Secondary | ICD-10-CM | POA: Diagnosis present

## 2020-03-31 MED ORDER — DIPHENHYDRAMINE HCL 50 MG/ML IJ SOLN: 50.0000 mg | Freq: Once | INTRAMUSCULAR | Status: DC | PRN

## 2020-03-31 MED ORDER — METHYLPREDNISOLONE SODIUM SUCC 125 MG IJ SOLR: 125.0000 mg | Freq: Once | INTRAMUSCULAR | Status: DC | PRN

## 2020-03-31 MED ORDER — SODIUM CHLORIDE 0.9 % IV SOLN: INTRAVENOUS | Status: DC | PRN

## 2020-03-31 MED ORDER — FAMOTIDINE IN NACL 20-0.9 MG/50ML-% IV SOLN: 20.0000 mg | Freq: Once | INTRAVENOUS | Status: DC | PRN

## 2020-03-31 MED ORDER — EPINEPHRINE 0.3 MG/0.3ML IJ SOAJ: 0.3000 mg | Freq: Once | INTRAMUSCULAR | Status: DC | PRN

## 2020-03-31 MED ORDER — ALBUTEROL SULFATE HFA 108 (90 BASE) MCG/ACT IN AERS: 2.0000 | INHALATION_SPRAY | Freq: Once | RESPIRATORY_TRACT | Status: DC | PRN

## 2020-03-31 MED ORDER — SOTROVIMAB 500 MG/8ML IV SOLN
500.0000 mg | Freq: Once | INTRAVENOUS | Status: AC
  Administered 2020-03-31: 500 mg via INTRAVENOUS

## 2020-03-31 NOTE — Progress Notes (Signed)
Diagnosis: COVID-19  Physician: Dr. Patrick Wright  Procedure: Covid Infusion Clinic Med: Sotrovimab infusion - Provided patient with sotrovimab fact sheet for patients, parents, and caregivers prior to infusion.   Complications: No immediate complications noted  Discharge: Discharged home    

## 2020-03-31 NOTE — Progress Notes (Signed)
I connected by phone with Frank Johnson on 03/31/2020 at 9:39 AM to discuss the potential use of a new treatment for mild to moderate COVID-19 viral infection in non-hospitalized patients.  This patient is a 65 y.o. male that meets the FDA criteria for Emergency Use Authorization of COVID monoclonal antibody sotrovimab.  Has a (+) direct SARS-CoV-2 viral test result  Has mild or moderate COVID-19   Is NOT hospitalized due to COVID-19  Is within 10 days of symptom onset  Has at least one of the high risk factor(s) for progression to severe COVID-19 and/or hospitalization as defined in EUA.  Specific high risk criteria : Older age (>/= 65 yo), BMI > 25 and Cardiovascular disease or hypertension   I have spoken and communicated the following to the patient or parent/caregiver regarding COVID monoclonal antibody treatment:  1. FDA has authorized the emergency use for the treatment of mild to moderate COVID-19 in adults and pediatric patients with positive results of direct SARS-CoV-2 viral testing who are 74 years of age and older weighing at least 40 kg, and who are at high risk for progressing to severe COVID-19 and/or hospitalization.  2. The significant known and potential risks and benefits of COVID monoclonal antibody, and the extent to which such potential risks and benefits are unknown.  3. Information on available alternative treatments and the risks and benefits of those alternatives, including clinical trials.  4. Patients treated with COVID monoclonal antibody should continue to self-isolate and use infection control measures (e.g., wear mask, isolate, social distance, avoid sharing personal items, clean and disinfect "high touch" surfaces, and frequent handwashing) according to CDC guidelines.   5. The patient or parent/caregiver has the option to accept or refuse COVID monoclonal antibody treatment.  After reviewing this information with the patient, the patient has  agreed to receive one of the available covid 19 monoclonal antibodies and will be provided an appropriate fact sheet prior to infusion. Scot Dock, NP 03/31/2020 9:39 AM

## 2020-03-31 NOTE — Progress Notes (Signed)
Patient reviewed Fact Sheet for Patients, Parents, and Caregivers for Emergency Use Authorization (EUA) of sotrovimab for the Treatment of Coronavirus. Patient also reviewed and is agreeable to the estimated cost of treatment. Patient is agreeable to proceed.   

## 2020-03-31 NOTE — Discharge Instructions (Signed)

## 2020-09-10 ENCOUNTER — Other Ambulatory Visit: Payer: Self-pay | Admitting: Internal Medicine

## 2020-09-10 ENCOUNTER — Other Ambulatory Visit (HOSPITAL_COMMUNITY): Payer: Self-pay | Admitting: Internal Medicine

## 2020-09-10 ENCOUNTER — Other Ambulatory Visit: Payer: Self-pay

## 2020-09-10 DIAGNOSIS — Z0283 Encounter for blood-alcohol and blood-drug test: Secondary | ICD-10-CM

## 2020-09-10 DIAGNOSIS — R42 Dizziness and giddiness: Secondary | ICD-10-CM

## 2020-09-10 DIAGNOSIS — R519 Headache, unspecified: Secondary | ICD-10-CM

## 2020-09-10 DIAGNOSIS — R270 Ataxia, unspecified: Secondary | ICD-10-CM

## 2020-09-10 NOTE — Progress Notes (Signed)
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  [facility] Acct # [id number] [facility] Specimen # [id number]  Rapid Drug Screen Results = Negative  AMD

## 2020-09-22 ENCOUNTER — Other Ambulatory Visit: Payer: Self-pay

## 2020-09-22 ENCOUNTER — Ambulatory Visit
Admission: RE | Admit: 2020-09-22 | Discharge: 2020-09-22 | Disposition: A | Discharge status: Home / Self Care | Payer: Medicare Other | Source: Ambulatory Visit | Attending: Infectious Diseases | Admitting: Infectious Diseases

## 2020-09-22 ENCOUNTER — Other Ambulatory Visit: Payer: Self-pay | Admitting: Infectious Diseases

## 2020-09-22 ENCOUNTER — Ambulatory Visit
Admission: RE | Admit: 2020-09-22 | Discharge: 2020-09-22 | Disposition: A | Discharge status: Home / Self Care | Payer: Medicare Other | Source: Ambulatory Visit | Attending: Internal Medicine | Admitting: Internal Medicine

## 2020-09-22 DIAGNOSIS — G9389 Other specified disorders of brain: Secondary | ICD-10-CM | POA: Insufficient documentation

## 2020-09-22 DIAGNOSIS — R270 Ataxia, unspecified: Secondary | ICD-10-CM

## 2020-09-22 DIAGNOSIS — R42 Dizziness and giddiness: Secondary | ICD-10-CM | POA: Insufficient documentation

## 2020-09-22 DIAGNOSIS — R519 Headache, unspecified: Secondary | ICD-10-CM | POA: Insufficient documentation

## 2020-09-22 IMAGING — MR MR HEAD W/ CM
4 series · 48 of 48 positions shown · IV contrast (10ml Gadavist)
Comparison: Same day MRI head.

CLINICAL DATA: Follow-up of mass seen in fourth ventricle on same
day MRI head.

EXAM:
MRI HEAD WITH CONTRAST
TECHNIQUE: Multiplanar, multiecho pulse sequences of the brain and surrounding
structures were obtained with intravenous contrast.
CONTRAST:  10mL GADAVIST GADOBUTROL 1 MMOL/ML IV SOLN

[Series 5: T1 · axial · 1.0mm · 0.98mm/px · z∈[-49,+122]mm · 21 of 176 slices shown]
[im 1/176]
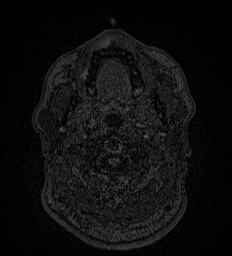
[im 9/176]
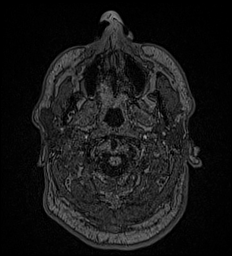
[im 18/176]
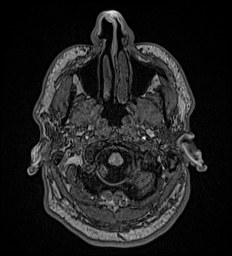
[im 27/176]
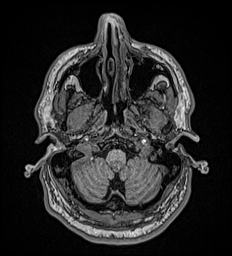
[im 36/176]
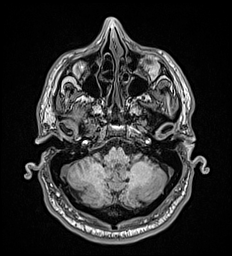
[im 44/176]
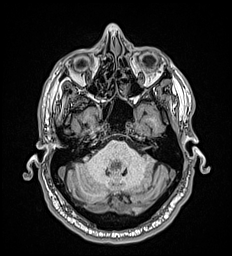
[im 53/176]
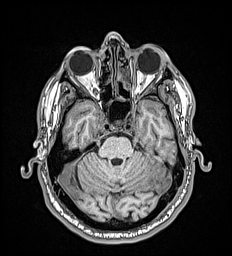
[im 62/176]
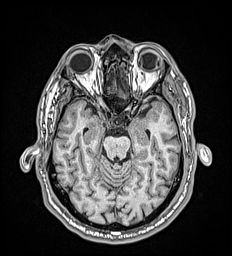
[im 71/176]
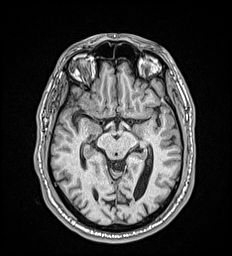
[im 79/176]
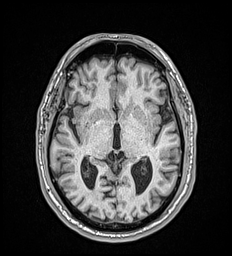
[im 88/176]
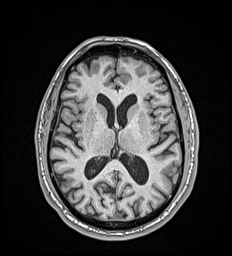
[im 97/176]
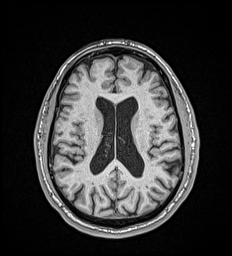
[im 106/176]
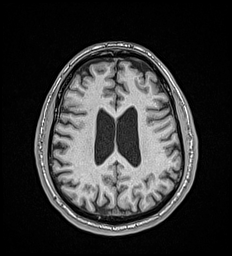
[im 114/176]
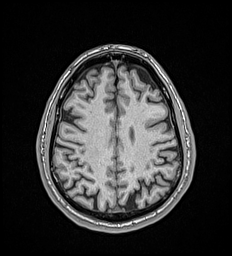
[im 123/176]
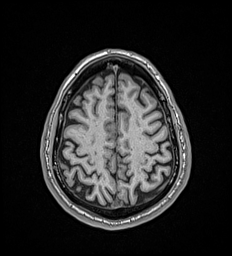
[im 132/176]
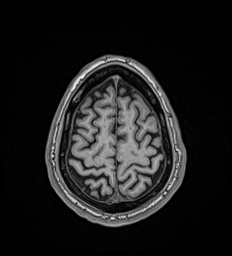
[im 141/176]
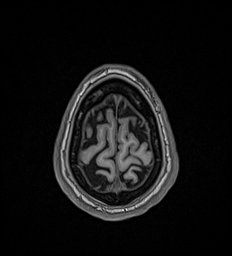
[im 149/176]
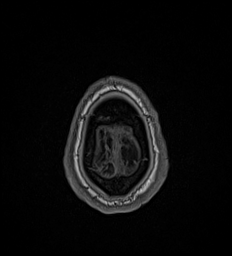
[im 158/176]
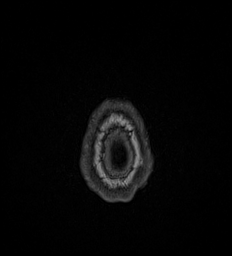
[im 167/176]
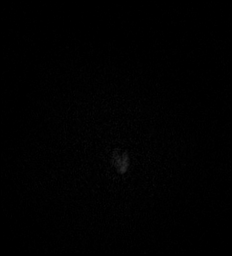
[im 176/176]
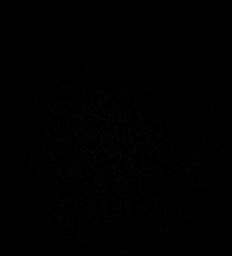

[Series 6: T1 post-contrast · axial · 1.0mm · 0.98mm/px · z∈[-49,+121]mm · 21 of 175 slices shown (1 of 3)]
[im 1/175]
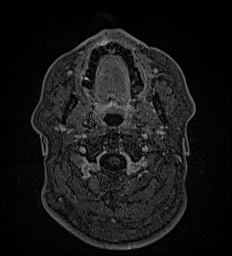
[im 9/175]
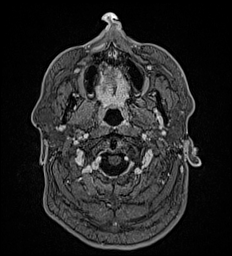
[im 18/175]
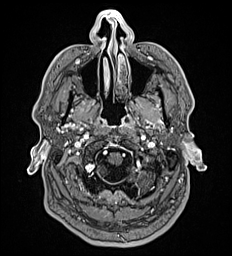
[im 27/175]
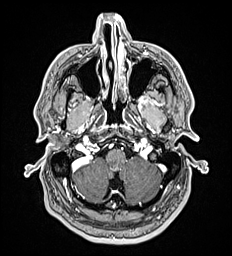
[im 35/175]
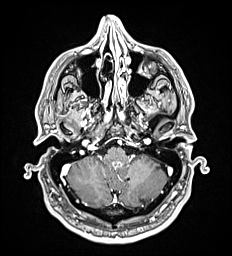
[im 44/175]
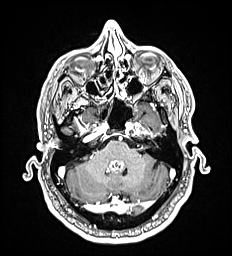
[im 53/175]
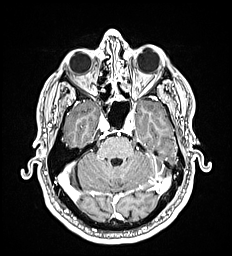
[im 61/175]
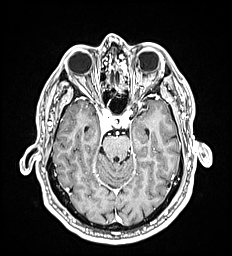
[im 70/175]
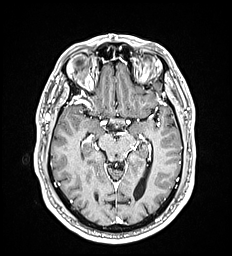
[im 79/175]
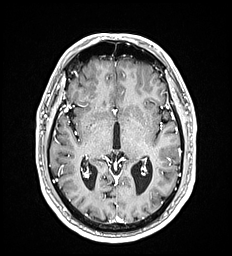
[im 88/175]
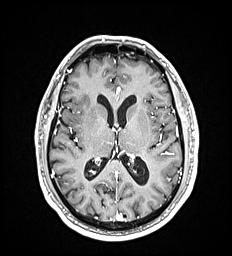
[im 96/175]
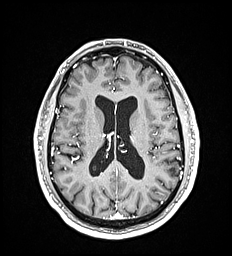
[im 105/175]
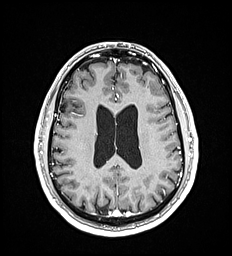
[im 114/175]
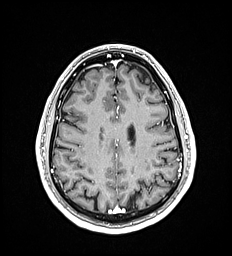
[im 122/175]
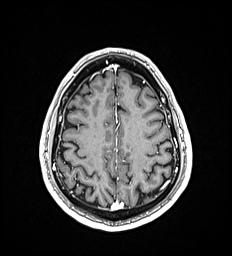
[im 131/175]
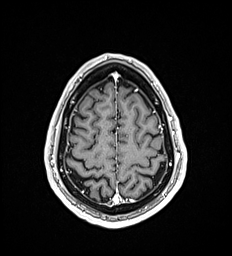
[im 140/175]
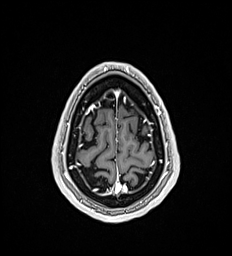
[im 148/175]
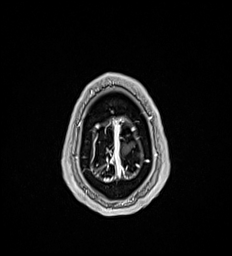
[im 157/175]
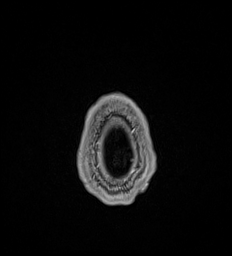
[im 166/175]
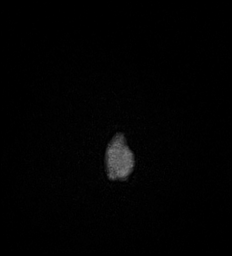
[im 175/175]
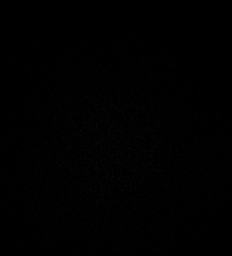

[Series 7: T1 post-contrast · coronal · 5.0mm · 0.57mm/px · 3 of 29 slices shown (2 of 3)]
[im 1/29]
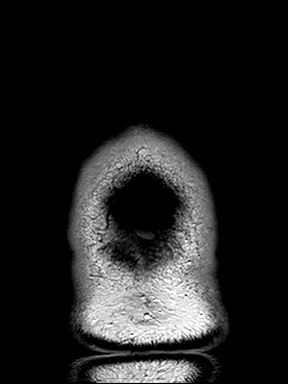
[im 15/29]
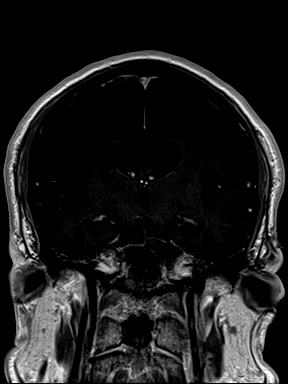
[im 29/29]
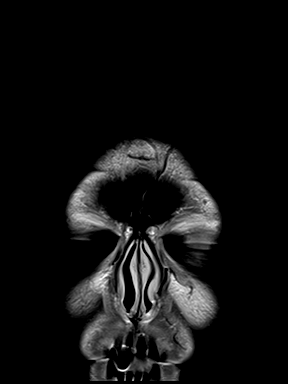

[Series 8: T1 post-contrast · sagittal · 5.0mm · 0.62mm/px · 3 of 25 slices shown (3 of 3)]
[im 1/25]
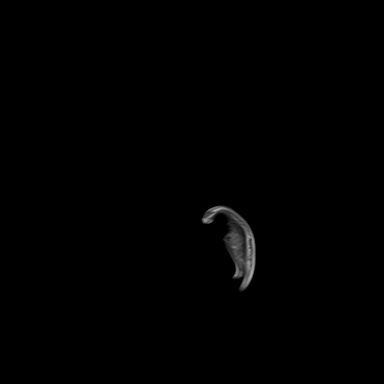
[im 13/25]
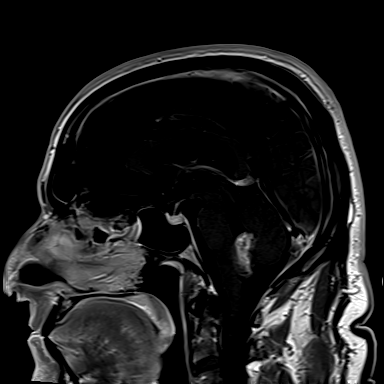
[im 25/25]
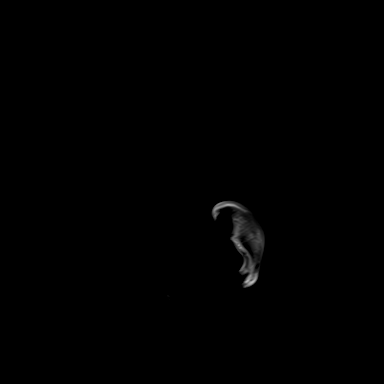

[48 of 48 positions shown; findings below may reference images not displayed]

FINDINGS: Postcontrast imaging was performed to further evaluate the fourth
ventricle mass seen on same day MRI. This lesion demonstrates
heterogeneous enhancement. No other enhancing lesions identified.
IMPRESSION: 1. The fourth ventricular mass seen on same day MRI demonstrates
heterogeneous enhancement. No other enhancing lesions identified.
Differential considerations include a choroid plexus tumor
(papilloma or carcinoma), subependymoma, and ependymoma. Metastasis
is possible, but thought unlikely given no other enhancing lesions
identified and no known primary malignancy on chart review.
2. MRI imaging of the entire spine with contrast is recommended to
evaluate for additional lesions/drop-metastases.

## 2020-09-22 IMAGING — MR MR HEAD W/O CM
12 series · 46 of 48 positions shown · non-contrast
Comparison: None.

CLINICAL DATA: Lightheadedness, ataxia, headache disorder;
technologist note states headache for 2 months

EXAM:
MRI HEAD WITHOUT CONTRAST
TECHNIQUE: Multiplanar, multiecho pulse sequences of the brain and surrounding
structures were obtained without intravenous contrast.

[Series 5: ax dwi_tracew · axial · 3.0mm · 0.65mm/px · z∈[-43,+112]mm · 3 of 48 slices shown]
[im 1/48]
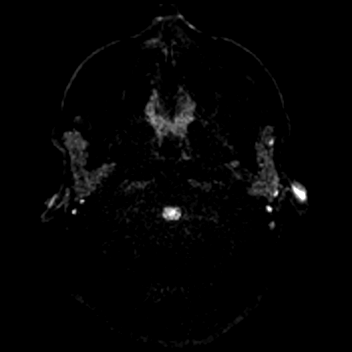
[im 24/48]
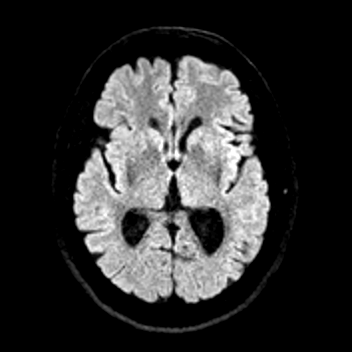
[im 48/48]
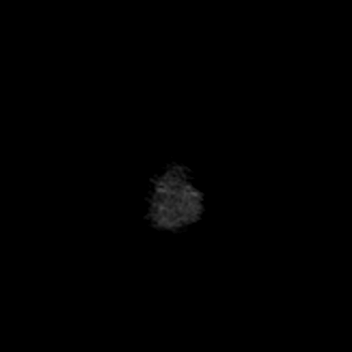

[Series 6: ax dwi_adc · axial · 3.0mm · 0.65mm/px · z∈[-43,+112]mm · 4 of 48 slices shown]
[im 1/48]
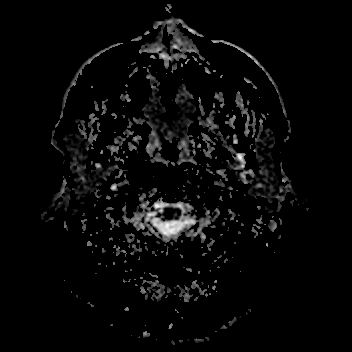
[im 16/48]
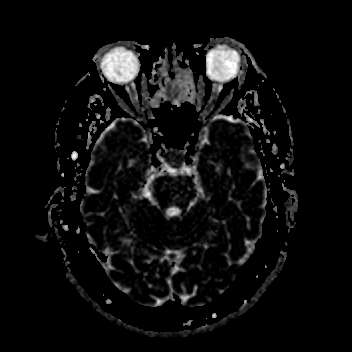
[im 32/48]
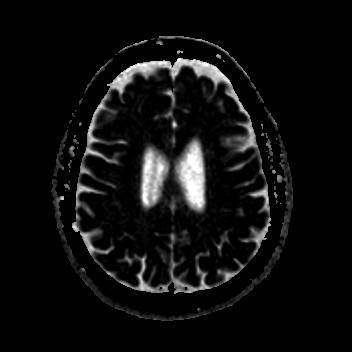
[im 48/48]
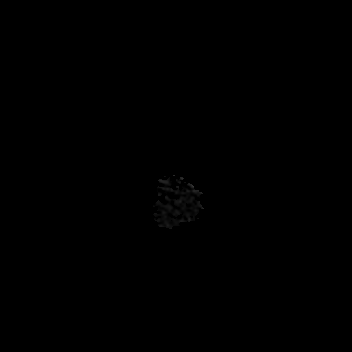

[Series 7: cor dwi_tracew · coronal · 5.0mm · 0.68mm/px · 3 of 40 slices shown]
[im 1/40]
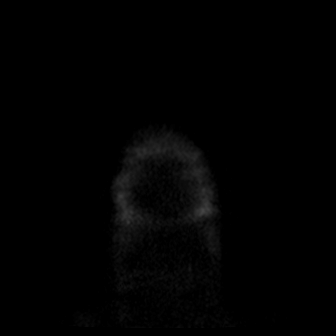
[im 20/40]
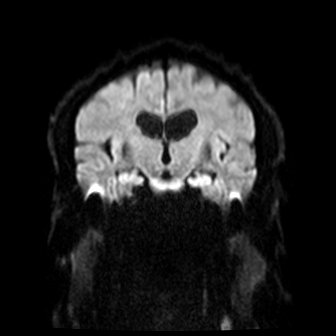
[im 40/40]
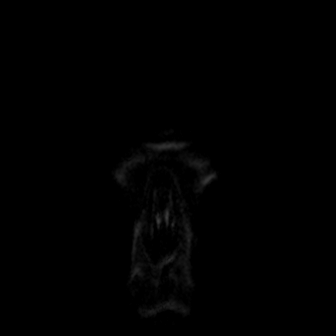

[Series 8: cor dwi_adc · coronal · 5.0mm · 0.68mm/px · 3 of 40 slices shown]
[im 1/40]
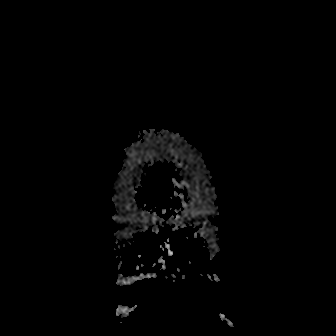
[im 20/40]
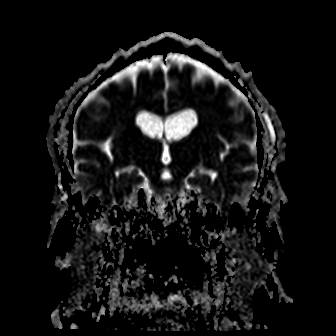
[im 40/40]
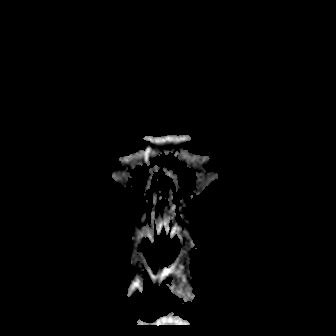

[Series 9: T1 · sagittal · 5.0mm · 0.62mm/px · 2 of 24 slices shown (1 of 2)]
[im 1/24]
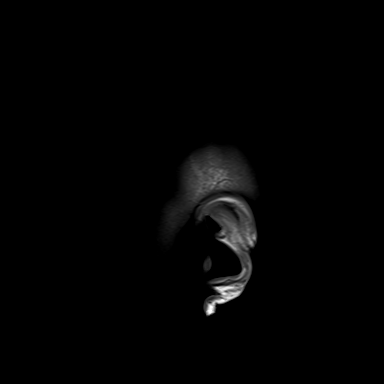
[im 24/24]
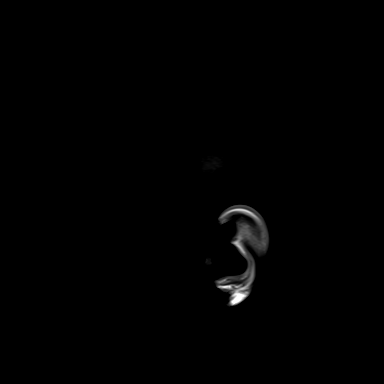

[Series 10: T2 · axial · 5.0mm · 0.53mm/px · z∈[-45,+110]mm · 2 of 27 slices shown (1 of 3)]
[im 1/27]
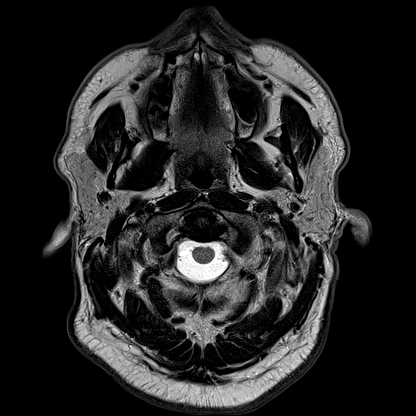
[im 27/27]
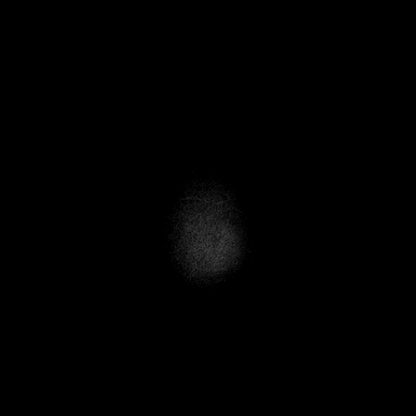

[Series 12: pha_images · axial · 3.0mm · 0.90mm/px · z∈[-56,+121]mm · 5 of 60 slices shown]
[im 1/60]
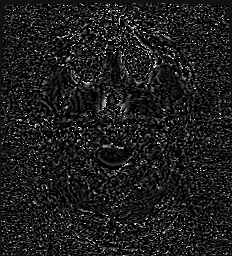
[im 15/60]
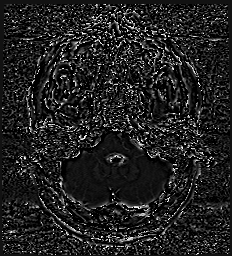
[im 30/60]
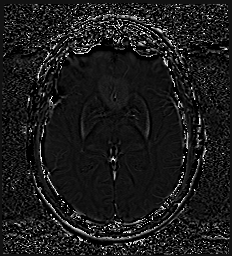
[im 45/60]
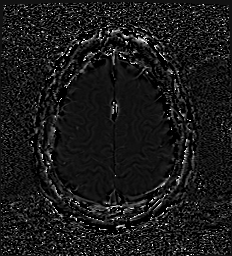
[im 60/60]
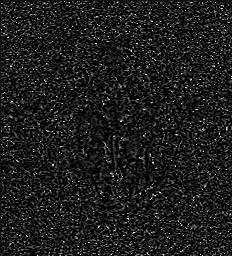

[Series 13: swi_images · axial · 3.0mm · 0.90mm/px · z∈[-56,+121]mm · 5 of 60 slices shown]
[im 1/60]
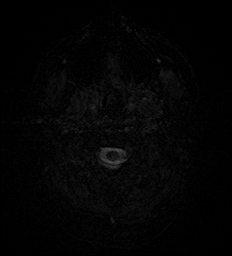
[im 15/60]
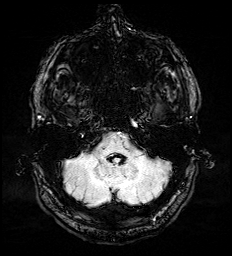
[im 30/60]
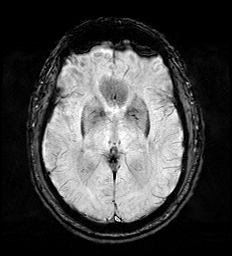
[im 45/60]
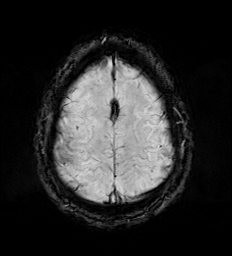
[im 60/60]
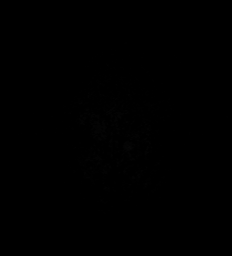

[Series 15: FLAIR · axial · 3.0mm · 0.53mm/px · z∈[-48,+113]mm · 4 of 55 slices shown]
[im 1/55]
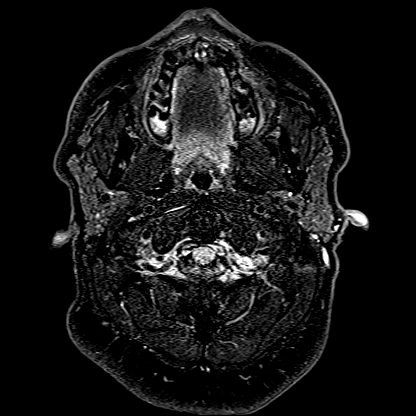
[im 19/55]
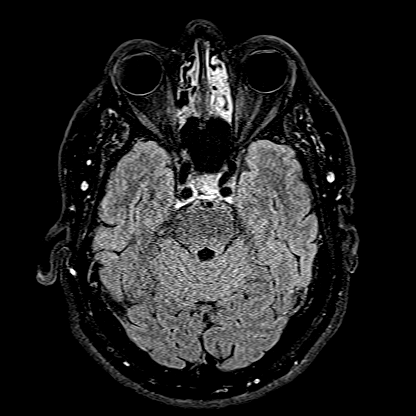
[im 37/55]
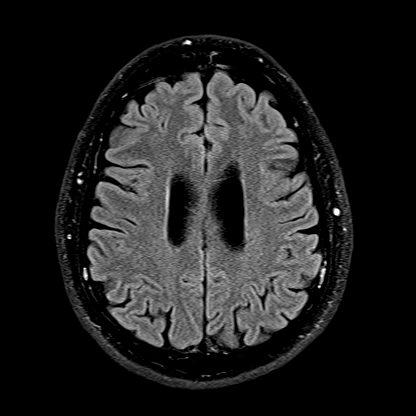
[im 55/55]
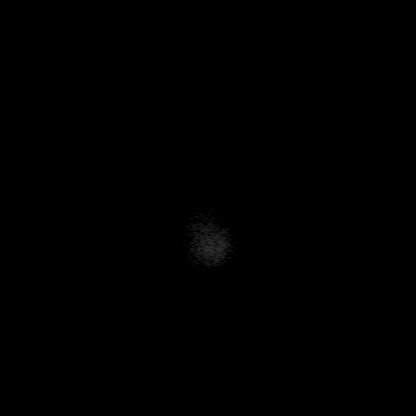

[Series 16: T1 · axial · 1.0mm · 0.98mm/px · z∈[-51,+123]mm · 11 of 174 slices shown (2 of 2)]
[im 1/174]
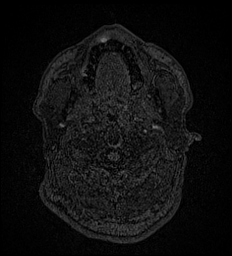
[im 15/174]
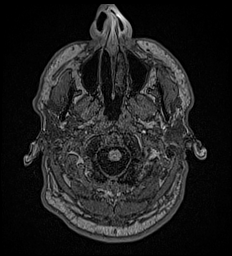
[im 29/174]
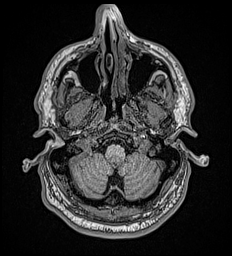
[im 44/174]
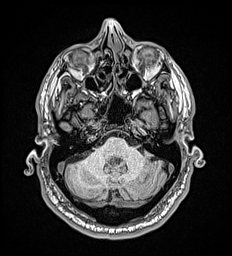
[im 58/174]
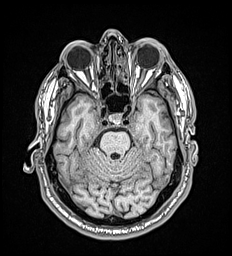
[im 73/174]
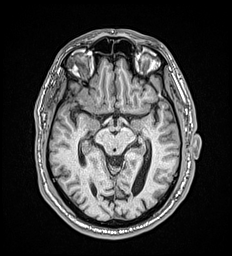
[im 87/174]
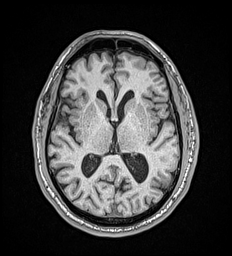
[im 101/174]
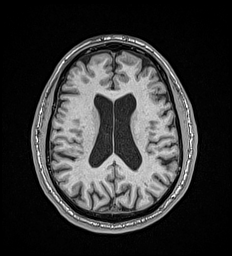
[im 116/174]
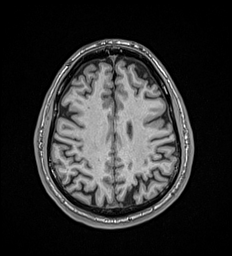
[im 145/174]
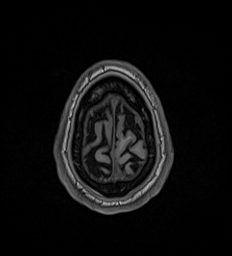
[im 174/174]
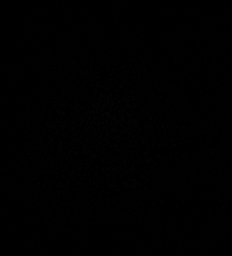

[Series 17: T2 · coronal · 5.0mm · 0.57mm/px · 2 of 29 slices shown (2 of 3)]
[im 1/29]
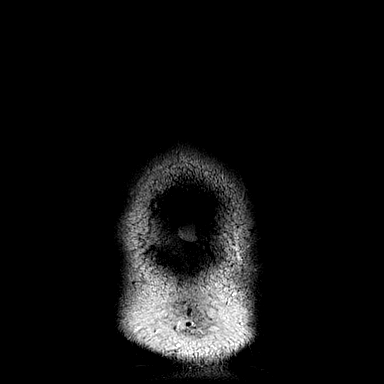
[im 29/29]
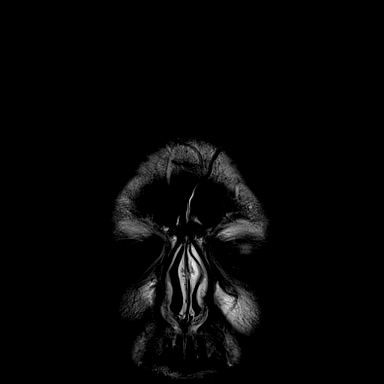

[Series 18: T2 · axial · 5.0mm · 0.53mm/px · z∈[-45,+110]mm · 2 of 27 slices shown (3 of 3)]
[im 1/27]
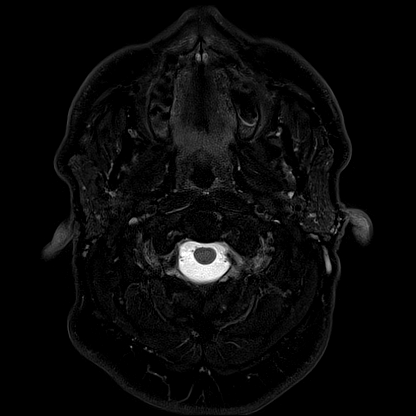
[im 27/27]
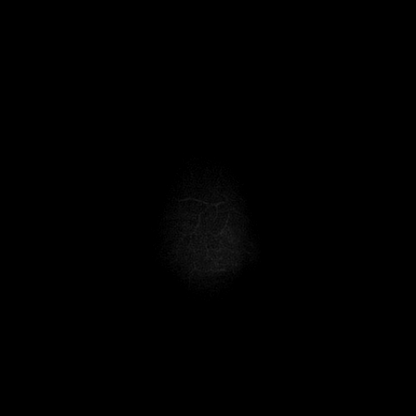

[46 of 48 positions shown; findings below may reference images not displayed]

FINDINGS: Brain: Within the inferior fourth ventricle, there is a
heterogeneous mass with approximate measurements of 11 x 17 x 29 mm.
Associated susceptibility may reflect mineralization or blood
products. No evidence of hydrocephalus.

Minimal small foci of T2 hyperintensity in the supratentorial white
matter are nonspecific but may reflect minor chronic microvascular
ischemic changes. There is no acute infarction. No extra-axial fluid
collection.

Vascular: Major vessel flow voids at the skull base are preserved.

Skull and upper cervical spine: Normal marrow signal is preserved.

Sinuses/Orbits: Paranasal sinus mucosal thickening. Bilateral lens
replacements.

Other: Sella is unremarkable.  Mastoid air cells are clear.
IMPRESSION: Heterogeneous mass within the inferior fourth ventricle. No
hydrocephalus at this time. Postcontrast imaging is recommended for
further characterization.

These results will be called to the ordering clinician or
representative by the Radiologist Assistant, and communication
documented in the PACS or [REDACTED].

## 2020-09-22 MED ORDER — GADOBUTROL 1 MMOL/ML IV SOLN
10.0000 mL | Freq: Once | INTRAVENOUS | Status: AC | PRN
  Administered 2020-09-22: 10 mL via INTRAVENOUS

## 2020-09-23 ENCOUNTER — Encounter: Payer: Self-pay | Admitting: Internal Medicine

## 2020-09-24 ENCOUNTER — Encounter
Admission: RE | Disposition: A | Discharge status: Home / Self Care | Payer: Self-pay | Source: Home / Self Care | Attending: Internal Medicine

## 2020-09-24 ENCOUNTER — Ambulatory Visit: Payer: Medicare Other | Admitting: Certified Registered Nurse Anesthetist

## 2020-09-24 ENCOUNTER — Ambulatory Visit
Admission: RE | Admit: 2020-09-24 | Discharge: 2020-09-24 | Disposition: A | Discharge status: Home / Self Care | Payer: Medicare Other | Attending: Internal Medicine | Admitting: Internal Medicine

## 2020-09-24 ENCOUNTER — Encounter: Payer: Self-pay | Admitting: Internal Medicine

## 2020-09-24 DIAGNOSIS — Z1211 Encounter for screening for malignant neoplasm of colon: Secondary | ICD-10-CM | POA: Diagnosis not present

## 2020-09-24 DIAGNOSIS — Z7952 Long term (current) use of systemic steroids: Secondary | ICD-10-CM | POA: Diagnosis not present

## 2020-09-24 DIAGNOSIS — Z791 Long term (current) use of non-steroidal anti-inflammatories (NSAID): Secondary | ICD-10-CM | POA: Diagnosis not present

## 2020-09-24 DIAGNOSIS — K5730 Diverticulosis of large intestine without perforation or abscess without bleeding: Secondary | ICD-10-CM | POA: Diagnosis not present

## 2020-09-24 DIAGNOSIS — N183 Chronic kidney disease, stage 3 unspecified: Secondary | ICD-10-CM | POA: Diagnosis not present

## 2020-09-24 DIAGNOSIS — Z7982 Long term (current) use of aspirin: Secondary | ICD-10-CM | POA: Diagnosis not present

## 2020-09-24 DIAGNOSIS — K640 First degree hemorrhoids: Secondary | ICD-10-CM | POA: Diagnosis not present

## 2020-09-24 DIAGNOSIS — I129 Hypertensive chronic kidney disease with stage 1 through stage 4 chronic kidney disease, or unspecified chronic kidney disease: Secondary | ICD-10-CM | POA: Insufficient documentation

## 2020-09-24 DIAGNOSIS — Q438 Other specified congenital malformations of intestine: Secondary | ICD-10-CM | POA: Diagnosis not present

## 2020-09-24 DIAGNOSIS — Z86010 Personal history of colonic polyps: Secondary | ICD-10-CM | POA: Insufficient documentation

## 2020-09-24 DIAGNOSIS — Z79899 Other long term (current) drug therapy: Secondary | ICD-10-CM | POA: Diagnosis not present

## 2020-09-24 HISTORY — DX: Secondary polycythemia: D75.1

## 2020-09-24 HISTORY — DX: Personal history of urinary calculi: Z87.442

## 2020-09-24 HISTORY — DX: Anxiety disorder, unspecified: F41.9

## 2020-09-24 HISTORY — DX: Unspecified osteoarthritis, unspecified site: M19.90

## 2020-09-24 HISTORY — PX: COLONOSCOPY WITH PROPOFOL: SHX5780

## 2020-09-24 HISTORY — DX: Presence of intraocular lens: Z96.1

## 2020-09-24 SURGERY — COLONOSCOPY WITH PROPOFOL
Anesthesia: General

## 2020-09-24 MED ORDER — SODIUM CHLORIDE 0.9 % IV SOLN: INTRAVENOUS | Status: DC

## 2020-09-24 MED ORDER — LIDOCAINE HCL (PF) 1 % IJ SOLN
INTRAMUSCULAR | Status: AC
  Filled 2020-09-24: qty 2

## 2020-09-24 MED ORDER — PROPOFOL 10 MG/ML IV BOLUS
INTRAVENOUS | Status: DC | PRN
  Administered 2020-09-24: 50 mg via INTRAVENOUS
  Administered 2020-09-24 (×2): 20 mg via INTRAVENOUS

## 2020-09-24 MED ORDER — PROPOFOL 500 MG/50ML IV EMUL
INTRAVENOUS | Status: DC | PRN
  Administered 2020-09-24: 100 ug/kg/min via INTRAVENOUS

## 2020-09-24 MED ORDER — LIDOCAINE HCL (CARDIAC) PF 100 MG/5ML IV SOSY
PREFILLED_SYRINGE | INTRAVENOUS | Status: DC | PRN
  Administered 2020-09-24: 50 mg via INTRAVENOUS

## 2020-09-24 NOTE — Op Note (Signed)
[facility] Patient Name: Frank Johnson Procedure Date: 09/24/2020 1:43 PM MRN: [id number] Account #: [id number] Date of Birth: 1955-10-03 Admit Type: Outpatient Age: 65 Room: [facility] ENDO ROOM 2 Gender: Male Note Status: Finalized Procedure:             Colonoscopy Indications:           Surveillance: Personal history of adenomatous polyps                         on last colonoscopy 3 years ago Providers:             Lorie Apley K. [name] MD, MD Referring MD:          Leonie Douglas. Doy Hutching, MD (Referring MD) Medicines:             Propofol per Anesthesia Complications:         No immediate complications. Procedure:             Pre-Anesthesia Assessment:                        - The risks and benefits of the procedure and the                         sedation options and risks were discussed with the                         patient. All questions were answered and informed                         consent was obtained.                        - Patient identification and proposed procedure were                         verified prior to the procedure by the nurse. The                         procedure was verified in the procedure room.                        - ASA Grade Assessment: III - A patient with severe                         systemic disease.                        - After reviewing the risks and benefits, the patient                         was deemed in satisfactory condition to undergo the                         procedure.                        After obtaining informed consent, the colonoscope was                         passed under direct vision.  Throughout the procedure,                         the patient's blood pressure, pulse, and oxygen                         saturations were monitored continuously. The                         Colonoscope was introduced through the anus and                         advanced to the the cecum, identified by  appendiceal                         orifice and ileocecal valve. The colonoscopy was                         technically difficult and complex due to restricted                         mobility of the colon, a redundant colon and an                         endoscope malfunction. Successful completion of the                         procedure was aided by changing the patient to a prone                         position, withdrawing the scope and replacing with the                         adult colonoscope and applying abdominal pressure. The                         patient tolerated the procedure well. The quality of                         the bowel preparation was adequate. The ileocecal                         valve, appendiceal orifice, and rectum were                         photographed. Findings:      The perianal and digital rectal examinations were normal. Pertinent       negatives include normal sphincter tone and no palpable rectal lesions.      Non-bleeding internal hemorrhoids were found during retroflexion. The       hemorrhoids were Grade I (internal hemorrhoids that do not prolapse).      Multiple small and large-mouthed diverticula were found in the entire       colon. There was no evidence of diverticular bleeding.      The colon (entire examined portion) was significantly redundant.      Normal mucosa was found in the entire colon.      There is no endoscopic evidence of inflammation, mass, polyps or  ulcerations in the entire colon.      The exam was otherwise without abnormality. Impression:            - Non-bleeding internal hemorrhoids.                        - Moderate diverticulosis in the entire examined                         colon. There was no evidence of diverticular bleeding.                        - Redundant colon.                        - Normal mucosa in the entire examined colon.                        - The examination was otherwise normal.                         - No specimens collected. Recommendation:        - Patient has a contact number available for                         emergencies. The signs and symptoms of potential                         delayed complications were discussed with the patient.                         Return to normal activities tomorrow. Written                         discharge instructions were provided to the patient.                        - Resume previous diet.                        - Continue present medications.                        - Repeat colonoscopy in 10 years for screening                         purposes.                        - Return to GI office PRN.                        - The findings and recommendations were discussed with                         the patient. Procedure Code(s):     --- Professional ---                        [id number], Colorectal cancer screening; colonoscopy on  individual at high risk Diagnosis Code(s):     --- Professional ---                        Q43.8, Other specified congenital malformations of                         intestine                        K57.30, Diverticulosis of large intestine without                         perforation or abscess without bleeding                        K64.0, First degree hemorrhoids                        Z86.010, Personal history of colonic polyps CPT copyright 2019 American [employer] Association. All rights reserved. The codes documented in this report are preliminary and upon coder review may  be revised to meet current compliance requirements. Efrain Sella MD, MD 09/24/2020 2:29:51 PM This report has been signed electronically. Number of Addenda: 0 Note Initiated On: 09/24/2020 1:43 PM Scope Withdrawal Time: 0 hours 6 minutes 9 seconds  Total Procedure Duration: 0 hours 24 minutes 27 seconds  Estimated Blood Loss:  Estimated blood loss: none.      [facility]

## 2020-09-24 NOTE — H&P (Signed)
Outpatient short stay form Pre-procedure 09/24/2020 12:28 PM Frank Johnson K. Frank Johnson, M.D.  Primary Physician: Fulton Reek, M.D.  Reason for visit:  Personal history of adenomatous polyps in April 2019 Colonoscopy - Dr. Vira Agar  History of present illness:                            Patient presents for colonoscopy for a personal hx of colon polyps. The patient denies abdominal pain, abnormal weight loss or rectal bleeding.     No current facility-administered medications for this encounter.  Current Outpatient Medications:    tamsulosin (FLOMAX) 0.4 MG CAPS capsule, Take 0.4 mg by mouth daily., Disp: , Rfl:    allopurinol (ZYLOPRIM) 300 MG tablet, Take 1 tablet (300 mg total) by mouth daily., Disp: 90 tablet, Rfl: 1   ALPRAZolam (XANAX) 0.5 MG tablet, TAKE 1 TABLET BY MOUTH DAILY., Disp: 30 tablet, Rfl: 2   aspirin 81 MG EC tablet, Take 162 mg by mouth daily. , Disp: , Rfl:    brompheniramine-pseudoephedrine-DM 30-2-10 MG/5ML syrup, Take 5 mLs by mouth 4 (four) times daily as needed. Mix with 5 mL of viscous lidocaine for swish and swallow., Disp: 120 mL, Rfl: 0   DUREZOL 0.05 % EMUL, , Disp: , Rfl:    lidocaine (XYLOCAINE) 2 % solution, Use as directed 5 mLs in the mouth or throat every 6 (six) hours as needed for mouth pain. Mix with 5 mL of Bromfed-DM for swish and swallow., Disp: 100 mL, Rfl: 0   lisinopril (ZESTRIL) 20 MG tablet, Take 1 tablet (20 mg total) by mouth daily., Disp: 90 tablet, Rfl: 0   meloxicam (MOBIC) 15 MG tablet, TAKE ONE TABLET EVERY DAY AS NEEDED (Patient taking differently: Take 15 mg by mouth daily.), Disp: 30 tablet, Rfl: 3   methylPREDNISolone (MEDROL DOSEPAK) 4 MG TBPK tablet, Take Tapered dose as directed, Disp: 21 tablet, Rfl: 0   ofloxacin (OCUFLOX) 0.3 % ophthalmic solution, Place 1 drop into the left eye 4 (four) times daily., Disp: , Rfl:    pilocarpine (PILOCAR) 1 % ophthalmic solution, Place 1 drop into the left eye 3 (three) times daily., Disp: , Rfl:     prednisoLONE acetate (PRED FORTE) 1 % ophthalmic suspension, SMARTSIG:1 Drop(s) Left Eye 8 Times Daily, Disp: , Rfl:    timolol (TIMOPTIC) 0.5 % ophthalmic solution, 1 drop 2 (two) times daily. (Patient not taking: No sig reported), Disp: , Rfl:    zolpidem (AMBIEN CR) 12.5 MG CR tablet, 1/2 tab - 1 tab po hs prn, Disp: 30 tablet, Rfl: 2  No medications prior to admission.     No Known Allergies   Past Medical History:  Diagnosis Date   Anxiety    Arthritis    Chronic back pain    CKD (chronic kidney disease), stage III (HCC)    ED (erectile dysfunction)    Gout    History of kidney stones    HTN (hypertension)    Insomnia    Kidney stone    Left inguinal hernia    Low HDL (under 40)    Overweight    Polycythemia    Pseudophakia of both eyes    Sleep apnea    Uses every night    Review of systems:  Otherwise negative.    Physical Exam  Gen: Alert, oriented. Appears stated age.  HEENT: [place]/AT. PERRLA. Lungs: CTA, no wheezes. CV: RR nl S1, S2. Abd: soft, benign, no masses.  BS+ Ext: No edema. Pulses 2+    Planned procedures: Proceed with colonoscopy. The patient understands the nature of the planned procedure, indications, risks, alternatives and potential complications including but not limited to bleeding, infection, perforation, damage to internal organs and possible oversedation/side effects from anesthesia. The patient agrees and gives consent to proceed.  Please refer to procedure notes for findings, recommendations and patient disposition/instructions.     Raela Bohl K. Frank Johnson, M.D. Gastroenterology 09/24/2020  12:28 PM

## 2020-09-24 NOTE — Transfer of Care (Signed)
Immediate Anesthesia Transfer of Care Note  Patient: Frank Johnson  Procedure(s) Performed: COLONOSCOPY WITH PROPOFOL  Patient Location: PACU  Anesthesia Type:General  Level of Consciousness: drowsy  Airway & Oxygen Therapy: Patient Spontanous Breathing  Post-op Assessment: Report given to RN  Post vital signs: Reviewed and stable  Last Vitals:  Vitals Value Taken Time  BP 94/66 09/24/20 1427  Temp 36.1 C 09/24/20 1425  Pulse 74 09/24/20 1427  Resp 18 09/24/20 1427  SpO2 92 % 09/24/20 1427  Vitals shown include unvalidated device data.  Last Pain:  Vitals:   09/24/20 1425  TempSrc:   PainSc: Asleep      Patients Stated Pain Goal: 0 ([id number])  Complications: No notable events documented.

## 2020-09-24 NOTE — Anesthesia Postprocedure Evaluation (Signed)
Anesthesia Post Note  Patient: Frank Johnson  Procedure(s) Performed: COLONOSCOPY WITH PROPOFOL  Patient location during evaluation: Endoscopy Anesthesia Type: General Level of consciousness: awake and alert and oriented Pain management: pain level controlled Vital Signs Assessment: post-procedure vital signs reviewed and stable Respiratory status: spontaneous breathing, nonlabored ventilation and respiratory function stable Cardiovascular status: blood pressure returned to baseline and stable Postop Assessment: no signs of nausea or vomiting Anesthetic complications: no   No notable events documented.   Last Vitals:  Vitals:   09/24/20 1425 09/24/20 1435  BP: 94/66 106/78  Pulse: 76 73  Resp: 18 18  Temp: (!) 36.1 C   SpO2: 92% 94%    Last Pain:  Vitals:   09/24/20 1435  TempSrc:   PainSc: 0-No pain                 [name]

## 2020-09-24 NOTE — Interval H&P Note (Signed)
History and Physical Interval Note:  09/24/2020 12:54 PM  Frank Johnson  has presented today for surgery, with the diagnosis of PERSONAL HX.OF COLON POLYPS.  The various methods of treatment have been discussed with the patient and family. After consideration of risks, benefits and other options for treatment, the patient has consented to  Procedure(s): COLONOSCOPY WITH PROPOFOL (N/A) as a surgical intervention.  The patient's history has been reviewed, patient examined, no change in status, stable for surgery.  I have reviewed the patient's chart and labs.  Questions were answered to the patient's satisfaction.     [place], [place]

## 2020-09-24 NOTE — Anesthesia Preprocedure Evaluation (Signed)
Anesthesia Evaluation  Patient identified by MRN, date of birth, ID band Patient awake    Reviewed: Allergy & Precautions, NPO status , Patient's Chart, lab work & pertinent test results  History of Anesthesia Complications Negative for: history of anesthetic complications  Airway Mallampati: III  TM Distance: >3 FB Neck ROM: Full    Dental  (+) Poor Dentition   Pulmonary sleep apnea , neg COPD,    breath sounds clear to auscultation- rhonchi (-) wheezing      Cardiovascular hypertension, Pt. on medications (-) CAD, (-) Past MI, (-) Cardiac Stents and (-) CABG  Rhythm:Regular Rate:Normal - Systolic murmurs and - Diastolic murmurs    Neuro/Psych neg Seizures Anxiety negative neurological ROS     GI/Hepatic negative GI ROS, Neg liver ROS,   Endo/Other  negative endocrine ROSneg diabetes  Renal/GU Renal disease: hx of nephrolithiasis.     Musculoskeletal  (+) Arthritis ,   Abdominal (+) + obese,   Peds  Hematology negative hematology ROS (+)   Anesthesia Other Findings Past Medical History: No date: Anxiety No date: Arthritis No date: Chronic back pain No date: CKD (chronic kidney disease), stage III (HCC) No date: ED (erectile dysfunction) No date: Gout No date: History of kidney stones No date: HTN (hypertension) No date: Insomnia No date: Kidney stone No date: Left inguinal hernia No date: Low HDL (under 40) No date: Overweight No date: Polycythemia No date: Pseudophakia of both eyes No date: Sleep apnea     Comment:  Uses every night   Reproductive/Obstetrics                             Anesthesia Physical Anesthesia Plan  ASA: 2  Anesthesia Plan: General   Post-op Pain Management:    Induction: Intravenous  PONV Risk Score and Plan: 1 and Propofol infusion  Airway Management Planned: Natural Airway  Additional Equipment:   Intra-op Plan:   Post-operative  Plan:   Informed Consent: I have reviewed the patients History and Physical, chart, labs and discussed the procedure including the risks, benefits and alternatives for the proposed anesthesia with the patient or authorized representative who has indicated his/her understanding and acceptance.     Dental advisory given  Plan Discussed with: CRNA and Anesthesiologist  Anesthesia Plan Comments:         Anesthesia Quick Evaluation

## 2020-09-25 ENCOUNTER — Encounter: Payer: Self-pay | Admitting: Internal Medicine

## 2020-12-05 ENCOUNTER — Other Ambulatory Visit: Payer: Self-pay | Admitting: Neurosurgery

## 2020-12-05 DIAGNOSIS — G9389 Other specified disorders of brain: Secondary | ICD-10-CM

## 2020-12-09 ENCOUNTER — Ambulatory Visit: Payer: Medicare Other

## 2020-12-31 ENCOUNTER — Other Ambulatory Visit: Payer: Self-pay | Admitting: Neurosurgery

## 2020-12-31 ENCOUNTER — Other Ambulatory Visit (HOSPITAL_COMMUNITY): Payer: Self-pay | Admitting: Neurosurgery

## 2020-12-31 DIAGNOSIS — C719 Malignant neoplasm of brain, unspecified: Secondary | ICD-10-CM

## 2021-01-13 ENCOUNTER — Other Ambulatory Visit: Payer: Self-pay

## 2021-01-13 ENCOUNTER — Ambulatory Visit: Payer: Medicare Other

## 2021-01-13 ENCOUNTER — Ambulatory Visit
Admission: RE | Admit: 2021-01-13 | Discharge: 2021-01-13 | Disposition: A | Discharge status: Home / Self Care | Payer: Medicare Other | Source: Ambulatory Visit | Attending: Neurosurgery | Admitting: Neurosurgery

## 2021-01-13 DIAGNOSIS — C719 Malignant neoplasm of brain, unspecified: Secondary | ICD-10-CM | POA: Diagnosis present

## 2021-01-13 IMAGING — MR MR LUMBAR SPINE WO/W CM
5 of 7 series · 26 of 48 positions shown · IV contrast (gadavist)
Comparison: Comparison made with previous brain MRIs from
[DATE].

CLINICAL DATA: Follow-up examination for fourth ventricular
ependymoma, status post resection on [DATE]. Evaluate for
possible metastatic disease.

EXAM:
MRI TOTAL SPINE WITHOUT AND WITH CONTRAST
TECHNIQUE: Multisequence MR imaging of the spine from the cervical spine to the
sacrum was performed prior to and following IV contrast
administration for evaluation of spinal metastatic disease.
CONTRAST:  10mL GADAVIST GADOBUTROL 1 MMOL/ML IV SOLN

[Series 1: T2 · sagittal · 4.0mm · 0.81mm/px · 4 of 17 slices shown (1 of 2)]
[im 1/17]
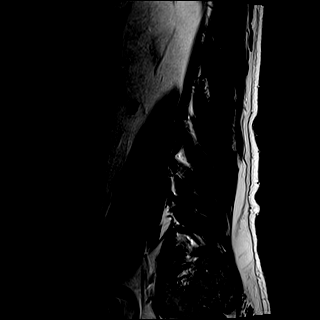
[im 6/17]
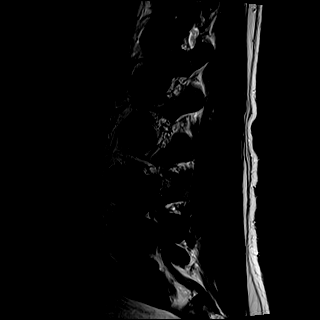
[im 11/17]
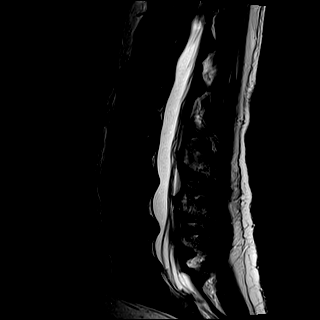
[im 17/17]
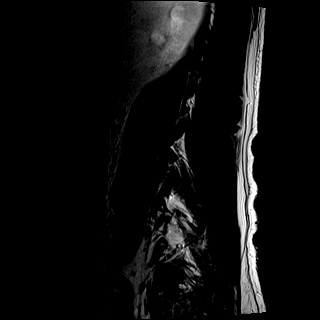

[Series 2: T1 · sagittal · 4.0mm · 0.81mm/px · 4 of 17 slices shown (1 of 2)]
[im 1/17]
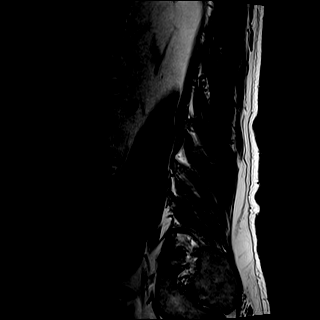
[im 6/17]
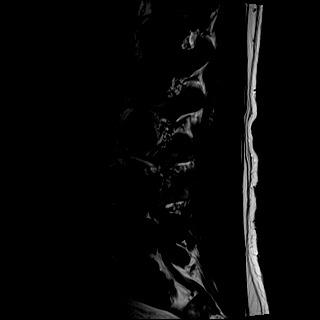
[im 11/17]
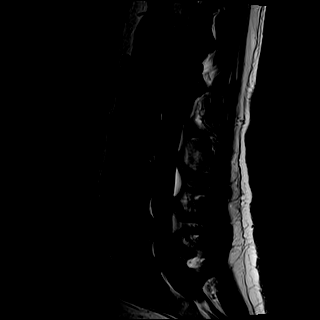
[im 17/17]
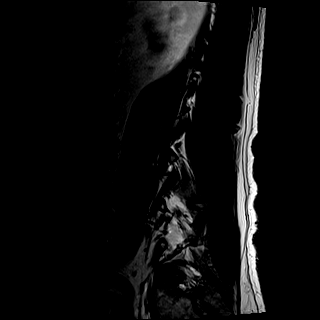

[Series 4: T2 · axial · 4.0mm · 0.78mm/px · z∈[-608,-387]mm · 8 of 37 slices shown (2 of 2)]
[im 1/37]
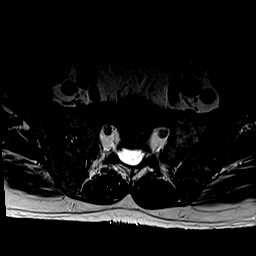
[im 5/37]
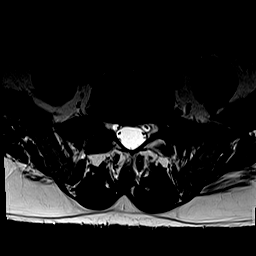
[im 13/37]
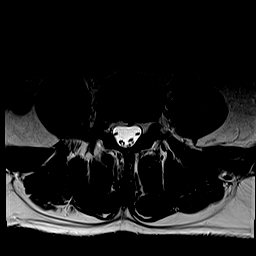
[im 17/37]
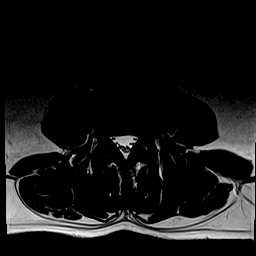
[im 21/37]
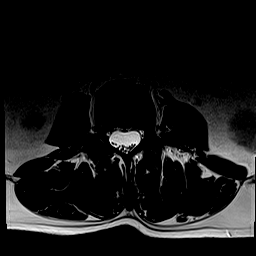
[im 25/37]
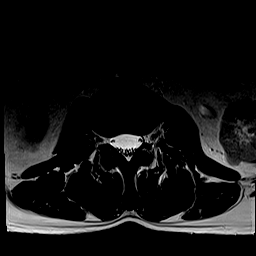
[im 33/37]
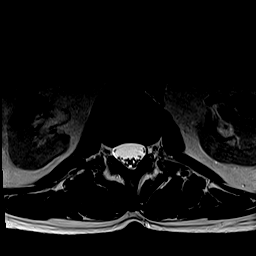
[im 37/37]
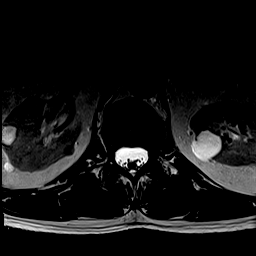

[Series 5: T1 · axial · 4.0mm · 0.39mm/px · z∈[-608,-387]mm · 8 of 37 slices shown (2 of 2)]
[im 1/37]
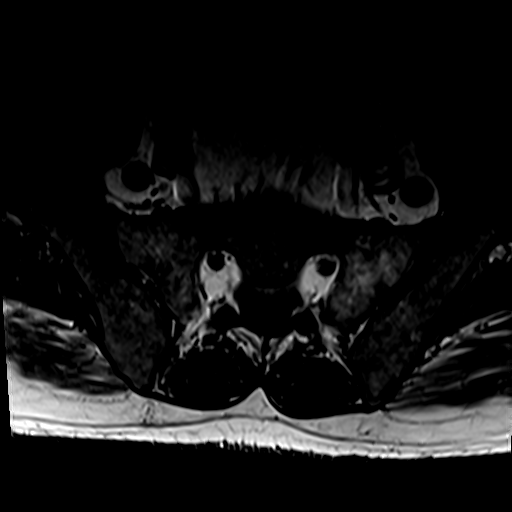
[im 5/37]
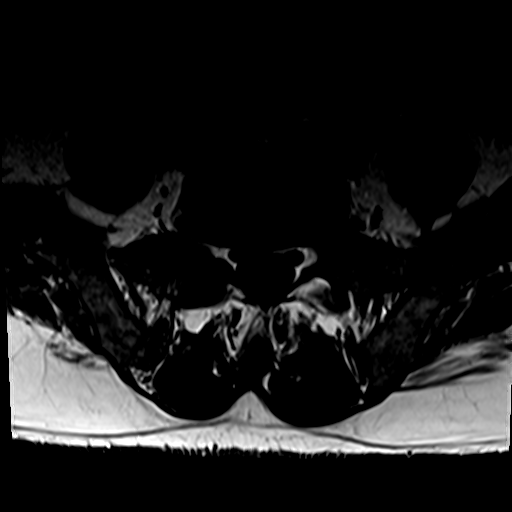
[im 13/37]
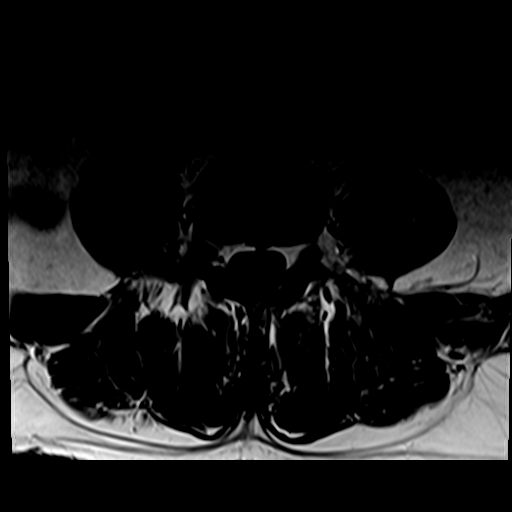
[im 17/37]
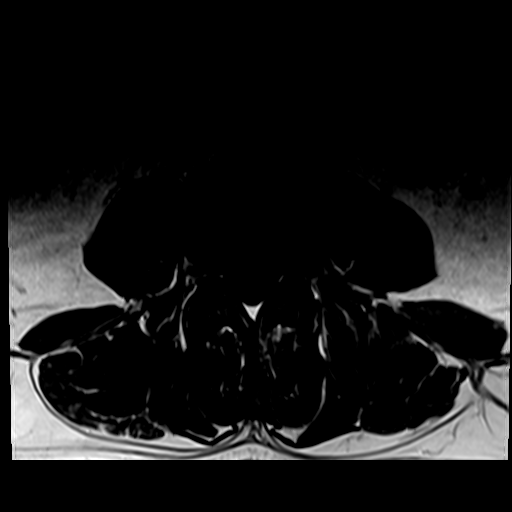
[im 21/37]
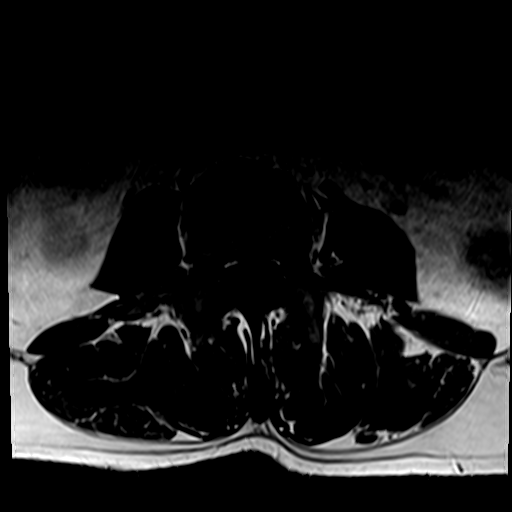
[im 25/37]
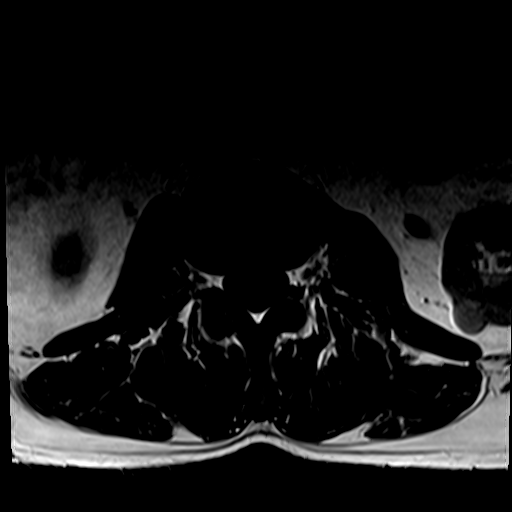
[im 33/37]
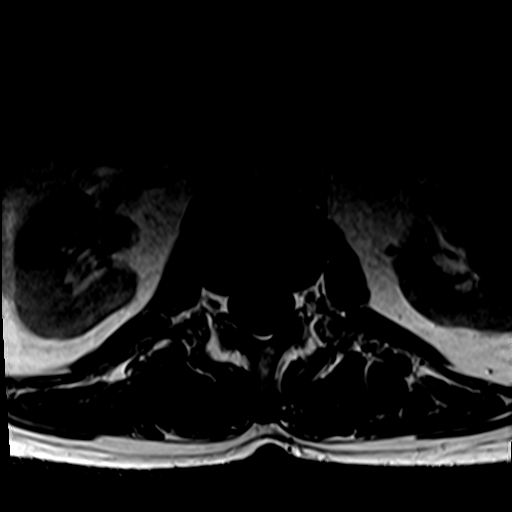
[im 37/37]
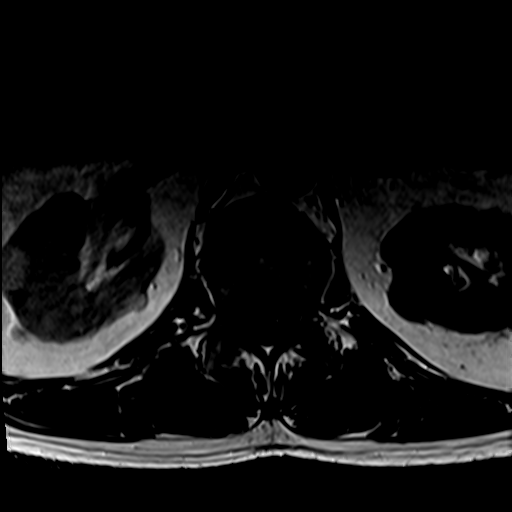

[Series 7: T1 fat-sat post-contrast · sagittal · 4.0mm · 0.81mm/px · 2 of 17 slices shown]
[im 1/17]
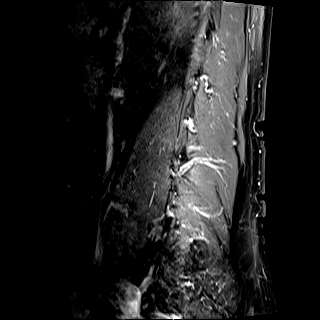
[im 5/17]
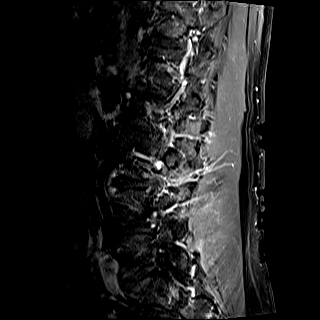

[26 of 48 positions shown; findings below may reference images not displayed]

FINDINGS: MRI CERVICAL SPINE FINDINGS

Alignment: Physiologic with preservation of the normal cervical
lordosis. No listhesis.

Vertebrae: Vertebral body height maintained without acute or chronic
fracture. Bone marrow signal intensity within normal limits.
Subcentimeter benign hemangioma noted within left lateral mass of
C1. No other discrete or worrisome osseous lesions. No abnormal
marrow edema or enhancement.

Cord: Normal signal and morphology. No abnormal enhancement. No
evidence for metastatic disease within the cervical spine or spinal
cord.

Posterior Fossa, vertebral arteries, paraspinal tissues: Extensive
postoperative changes from recent suboccipital craniotomy for fourth
ventricular tumor resection are partially visualized at the skull
base and posterior fossa. Residual heterogeneous collection at the
craniotomy site measures approximately 5.6 x 3.9 cm. Inferior
extension to the level of C3 within the posterior paraspinous soft
tissues (series 3, image 9). Diffuse enhancement throughout this
region likely postoperative in nature. Small amount of enhancement
seen along the dorsal surface of the medulla and/or fourth
ventricular outflow tract, partially visualized (series 7, image 8).
This could be postoperative in nature, but is incompletely assessed
on this exam.

Remainder of the paraspinous soft tissues within normal limits.
Normal flow voids seen within the vertebral arteries bilaterally. 1
cm left thyroid nodule noted, of doubtful significance given size
and patient age, no follow-up imaging recommended (ref: [HOSPITAL]. [DATE]): 143-50).

Disc levels:

Mild noncompressive disc bulging noted at C3-4 through C5-6 without
significant spinal stenosis. Moderate right C6 foraminal narrowing
related to disc bulge and uncovertebral disease (series 5, image
19). No other significant foraminal encroachment within the cervical
spine.

MRI THORACIC SPINE FINDINGS

Alignment: Physiologic with preservation of the normal thoracic
kyphosis. No listhesis.

Vertebrae: Vertebral body height maintained without acute or chronic
fracture. Bone marrow signal intensity mildly heterogeneous but
overall within normal limits. No worrisome osseous lesions. No
abnormal marrow edema or enhancement.

Cord: Normal signal and morphology. No abnormal enhancement. No
evidence for metastatic disease within the thoracic spine.

Paraspinal and other soft tissues: Paraspinous soft tissues
demonstrate no acute finding. 1.6 cm benign appearing cyst noted
within the right hepatic lobe. Multiple scattered cysts noted about
the partially visualized kidneys, a few of which demonstrate
intrinsic T1 hyperintensity, likely proteinaceous and/or hemorrhagic
cyst. Mild atelectatic changes at the left lung base.

Disc levels:

Tiny left paracentral disc protrusion at T9-10 without significant
stenosis (series 21, image 29). Otherwise, no other significant disc
pathology for patient age. No significant spinal stenosis. Foramina
remain patent. No overt neural impingement.

MRI LUMBAR SPINE FINDINGS

Segmentation: Standard. Lowest well-formed disc space labeled the
L5-S1 level.

Alignment: Mild dextroscoliosis. Alignment otherwise normal with
preservation of the normal lumbar lordosis.

Vertebrae: Vertebral body height maintained without acute or chronic
fracture. Bone marrow signal intensity mildly heterogeneous but
overall within normal limits. No worrisome osseous lesions. No
abnormal marrow edema or enhancement.

Conus medullaris: Extends to the L1 level and appears normal. No
abnormal enhancement or evidence for metastatic disease within the
lumbar spine.

Paraspinal and other soft tissues: Paraspinous soft tissues within
normal limits. Multiple scattered cysts noted about the kidneys
bilaterally, few which demonstrate intrinsic T1 hyperintensity,
which could reflect proteinaceous and/or hemorrhagic cyst. There is
a somewhat solid-appearing lesion measuring approximately 1.5 cm at
the posterior aspect of the visualized left kidney (series 4, image
14). An additional indeterminate 2 cm exophytic lesion extends from
the lower pole of the right kidney (series 4, image 15). These
findings are indeterminate and incompletely assessed on this exam.
Remainder of the visualized visceral structures otherwise
unremarkable.

Disc levels:

L1-2:  Unremarkable.

L2-3:  Unremarkable.

L3-4: Mild intervertebral disc space narrowing with diffuse disc
bulge and disc desiccation. Superimposed left foraminal to
extraforaminal disc protrusion contacts the exiting left L3 nerve
root (series 4, image 21). Mild facet hypertrophy. No significant
spinal stenosis. Mild left greater than right L3 foraminal
narrowing.

L4-5: Degenerative intervertebral disc space narrowing with diffuse
disc bulge and disc desiccation. Superimposed right foraminal disc
protrusion closely approximates the exiting right L4 nerve root
(series 1, image 4). Mild facet hypertrophy. Mild narrowing of the
lateral recesses without significant spinal stenosis. Moderate right
with mild left L4 foraminal stenosis.

L5-S1: Degenerative intervertebral disc space narrowing with diffuse
disc bulge and disc desiccation. Mild facet hypertrophy. No spinal
stenosis. Foramina remain patent.
IMPRESSION: 1. Postoperative changes from recent suboccipital craniotomy for
fourth ventricular tumor resection. Small amount of enhancement
along the dorsal surface of the medulla and/or fourth ventricular
outflow tract, partially visualized. While this could be
postoperative in nature, this is incompletely assessed on this exam.
Dedicated MRI of the brain, with and without contrast, recommended
for further evaluation.
2. No evidence for metastatic disease within the cervical, thoracic,
or lumbar spine.
3. 5.6 collection at the craniotomy site with associated
postoperative changes throughout the adjacent suboccipital soft
tissues.
4. Multiple bilateral renal cysts, several which are complex in
appearance, with a few indeterminate and possibly solid lesions
involving the bilateral kidneys as above, indeterminate. Further
evaluation with dedicated renal mass protocol CT and/or MRI
recommended for further evaluation.
5. Mild multilevel degenerative spondylosis without significant
spinal stenosis. Moderate right C6 foraminal narrowing related to
disc bulge and uncovertebral disease. Foraminal to extraforaminal
disc protrusions on the left at L3-4 and on the right at L4-5 as
above.

## 2021-01-13 IMAGING — MR MR CERVICAL SPINE WO/W CM
4 of 8 series · 24 of 48 positions shown · IV contrast (10ml Gadavist)
Comparison: Comparison made with previous brain MRIs from
[DATE].

CLINICAL DATA: Follow-up examination for fourth ventricular
ependymoma, status post resection on [DATE]. Evaluate for
possible metastatic disease.

EXAM:
MRI TOTAL SPINE WITHOUT AND WITH CONTRAST
TECHNIQUE: Multisequence MR imaging of the spine from the cervical spine to the
sacrum was performed prior to and following IV contrast
administration for evaluation of spinal metastatic disease.
CONTRAST:  10mL GADAVIST GADOBUTROL 1 MMOL/ML IV SOLN

[Series 1: T2 · sagittal · 3.0mm · 0.62mm/px · 4 of 15 slices shown (1 of 2)]
[im 1/15]
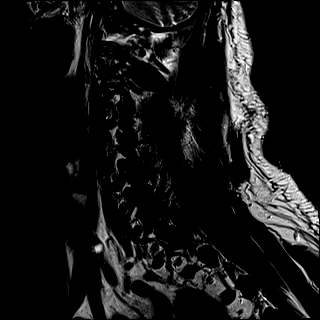
[im 5/15]
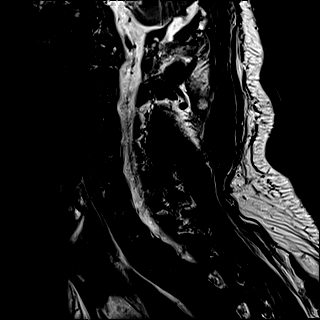
[im 10/15]
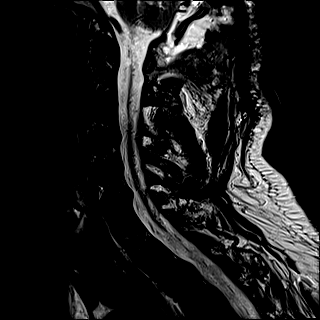
[im 15/15]
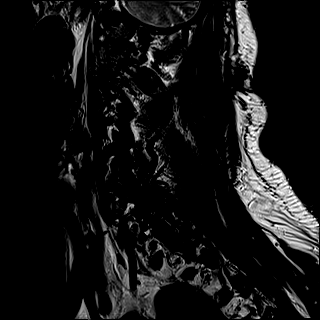

[Series 3: STIR · sagittal · 3.0mm · 0.62mm/px · 4 of 15 slices shown]
[im 1/15]
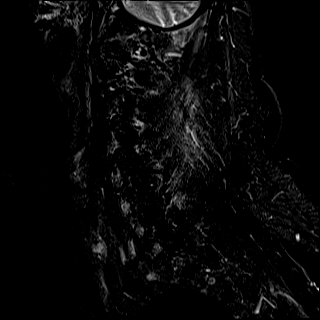
[im 5/15]
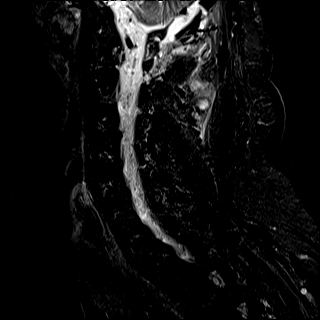
[im 10/15]
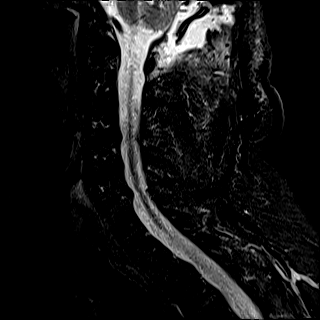
[im 15/15]
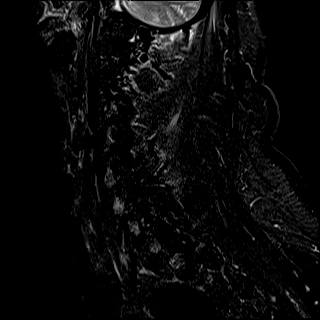

[Series 4: T2 · axial · 3.0mm · 0.70mm/px · z∈[-119,-14]mm · 8 of 33 slices shown (2 of 2)]
[im 1/33]
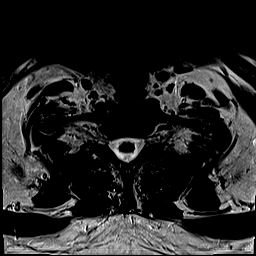
[im 5/33]
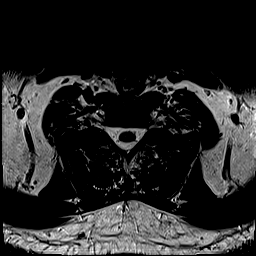
[im 10/33]
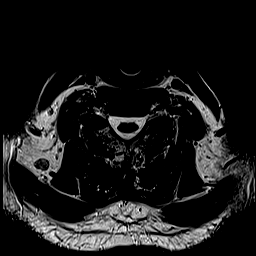
[im 14/33]
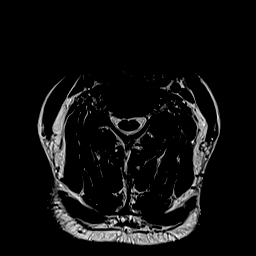
[im 19/33]
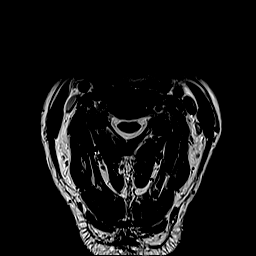
[im 23/33]
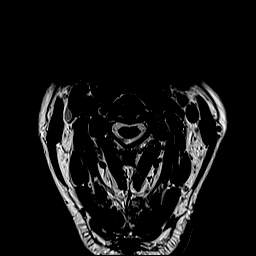
[im 28/33]
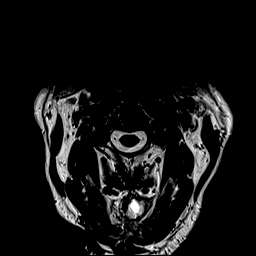
[im 33/33]
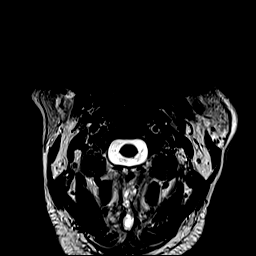

[Series 6: T1 · axial · non-contrast · 3.0mm · 0.35mm/px · z∈[-119,-14]mm · 8 of 33 slices shown]
[im 1/33]
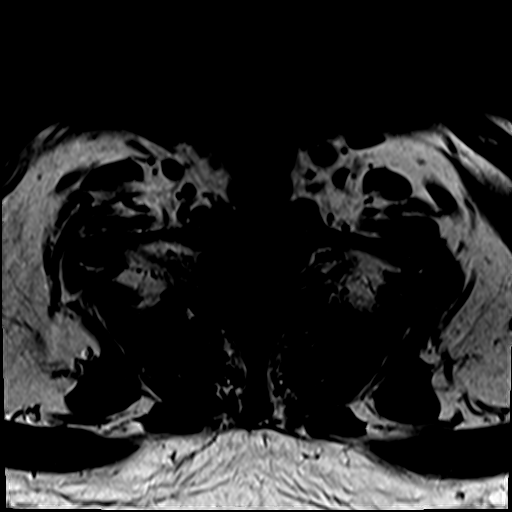
[im 5/33]
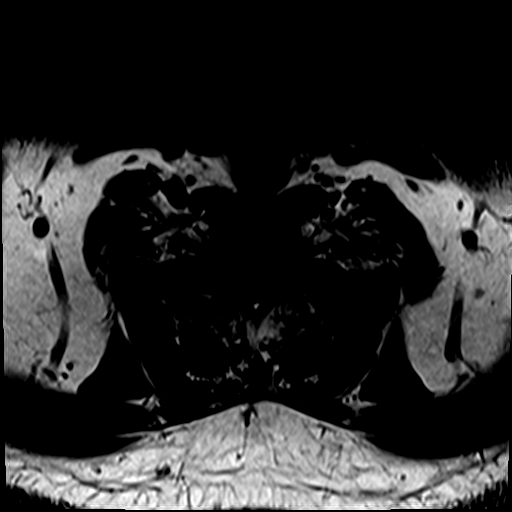
[im 10/33]
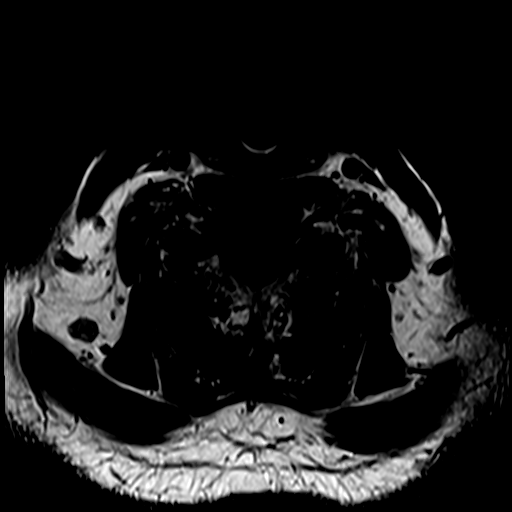
[im 14/33]
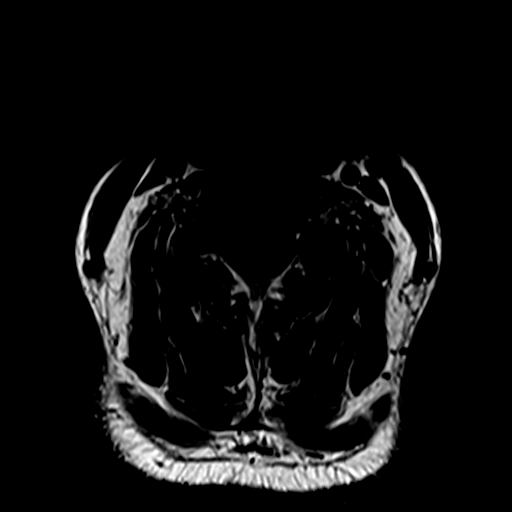
[im 19/33]
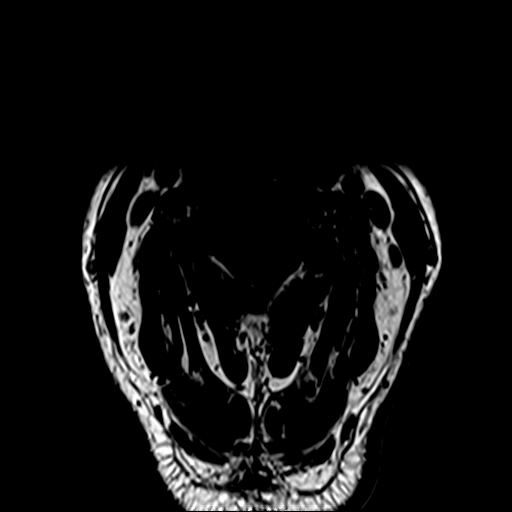
[im 23/33]
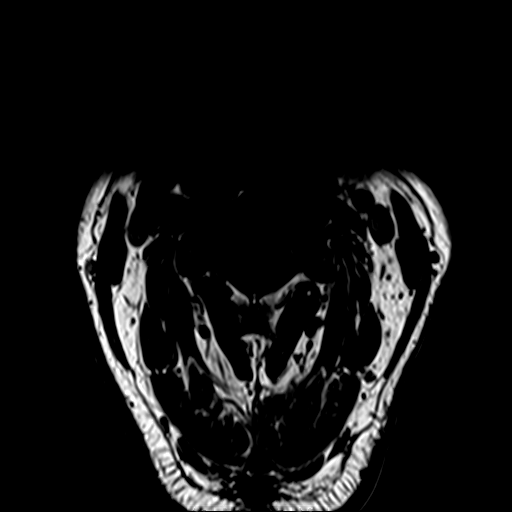
[im 28/33]
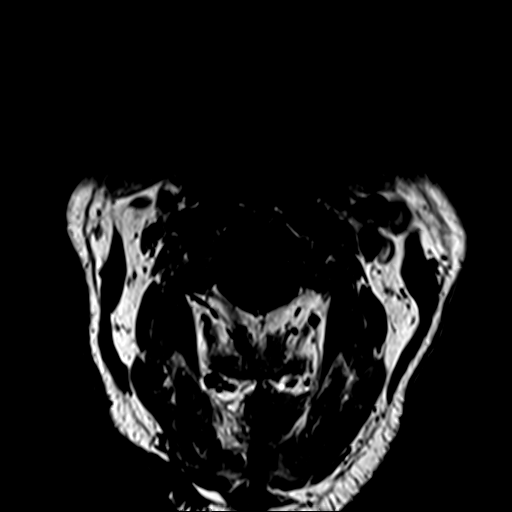
[im 33/33]
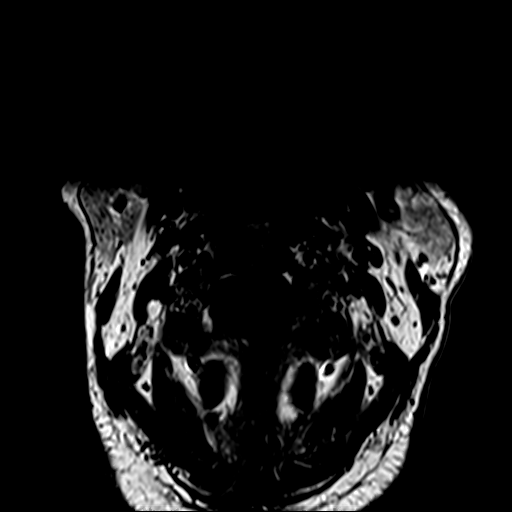

[24 of 48 positions shown; findings below may reference images not displayed]

FINDINGS: MRI CERVICAL SPINE FINDINGS

Alignment: Physiologic with preservation of the normal cervical
lordosis. No listhesis.

Vertebrae: Vertebral body height maintained without acute or chronic
fracture. Bone marrow signal intensity within normal limits.
Subcentimeter benign hemangioma noted within left lateral mass of
C1. No other discrete or worrisome osseous lesions. No abnormal
marrow edema or enhancement.

Cord: Normal signal and morphology. No abnormal enhancement. No
evidence for metastatic disease within the cervical spine or spinal
cord.

Posterior Fossa, vertebral arteries, paraspinal tissues: Extensive
postoperative changes from recent suboccipital craniotomy for fourth
ventricular tumor resection are partially visualized at the skull
base and posterior fossa. Residual heterogeneous collection at the
craniotomy site measures approximately 5.6 x 3.9 cm. Inferior
extension to the level of C3 within the posterior paraspinous soft
tissues (series 3, image 9). Diffuse enhancement throughout this
region likely postoperative in nature. Small amount of enhancement
seen along the dorsal surface of the medulla and/or fourth
ventricular outflow tract, partially visualized (series 7, image 8).
This could be postoperative in nature, but is incompletely assessed
on this exam.

Remainder of the paraspinous soft tissues within normal limits.
Normal flow voids seen within the vertebral arteries bilaterally. 1
cm left thyroid nodule noted, of doubtful significance given size
and patient age, no follow-up imaging recommended (ref: [HOSPITAL]. [DATE]): 143-50).

Disc levels:

Mild noncompressive disc bulging noted at C3-4 through C5-6 without
significant spinal stenosis. Moderate right C6 foraminal narrowing
related to disc bulge and uncovertebral disease (series 5, image
19). No other significant foraminal encroachment within the cervical
spine.

MRI THORACIC SPINE FINDINGS

Alignment: Physiologic with preservation of the normal thoracic
kyphosis. No listhesis.

Vertebrae: Vertebral body height maintained without acute or chronic
fracture. Bone marrow signal intensity mildly heterogeneous but
overall within normal limits. No worrisome osseous lesions. No
abnormal marrow edema or enhancement.

Cord: Normal signal and morphology. No abnormal enhancement. No
evidence for metastatic disease within the thoracic spine.

Paraspinal and other soft tissues: Paraspinous soft tissues
demonstrate no acute finding. 1.6 cm benign appearing cyst noted
within the right hepatic lobe. Multiple scattered cysts noted about
the partially visualized kidneys, a few of which demonstrate
intrinsic T1 hyperintensity, likely proteinaceous and/or hemorrhagic
cyst. Mild atelectatic changes at the left lung base.

Disc levels:

Tiny left paracentral disc protrusion at T9-10 without significant
stenosis (series 21, image 29). Otherwise, no other significant disc
pathology for patient age. No significant spinal stenosis. Foramina
remain patent. No overt neural impingement.

MRI LUMBAR SPINE FINDINGS

Segmentation: Standard. Lowest well-formed disc space labeled the
L5-S1 level.

Alignment: Mild dextroscoliosis. Alignment otherwise normal with
preservation of the normal lumbar lordosis.

Vertebrae: Vertebral body height maintained without acute or chronic
fracture. Bone marrow signal intensity mildly heterogeneous but
overall within normal limits. No worrisome osseous lesions. No
abnormal marrow edema or enhancement.

Conus medullaris: Extends to the L1 level and appears normal. No
abnormal enhancement or evidence for metastatic disease within the
lumbar spine.

Paraspinal and other soft tissues: Paraspinous soft tissues within
normal limits. Multiple scattered cysts noted about the kidneys
bilaterally, few which demonstrate intrinsic T1 hyperintensity,
which could reflect proteinaceous and/or hemorrhagic cyst. There is
a somewhat solid-appearing lesion measuring approximately 1.5 cm at
the posterior aspect of the visualized left kidney (series 4, image
14). An additional indeterminate 2 cm exophytic lesion extends from
the lower pole of the right kidney (series 4, image 15). These
findings are indeterminate and incompletely assessed on this exam.
Remainder of the visualized visceral structures otherwise
unremarkable.

Disc levels:

L1-2:  Unremarkable.

L2-3:  Unremarkable.

L3-4: Mild intervertebral disc space narrowing with diffuse disc
bulge and disc desiccation. Superimposed left foraminal to
extraforaminal disc protrusion contacts the exiting left L3 nerve
root (series 4, image 21). Mild facet hypertrophy. No significant
spinal stenosis. Mild left greater than right L3 foraminal
narrowing.

L4-5: Degenerative intervertebral disc space narrowing with diffuse
disc bulge and disc desiccation. Superimposed right foraminal disc
protrusion closely approximates the exiting right L4 nerve root
(series 1, image 4). Mild facet hypertrophy. Mild narrowing of the
lateral recesses without significant spinal stenosis. Moderate right
with mild left L4 foraminal stenosis.

L5-S1: Degenerative intervertebral disc space narrowing with diffuse
disc bulge and disc desiccation. Mild facet hypertrophy. No spinal
stenosis. Foramina remain patent.
IMPRESSION: 1. Postoperative changes from recent suboccipital craniotomy for
fourth ventricular tumor resection. Small amount of enhancement
along the dorsal surface of the medulla and/or fourth ventricular
outflow tract, partially visualized. While this could be
postoperative in nature, this is incompletely assessed on this exam.
Dedicated MRI of the brain, with and without contrast, recommended
for further evaluation.
2. No evidence for metastatic disease within the cervical, thoracic,
or lumbar spine.
3. 5.6 collection at the craniotomy site with associated
postoperative changes throughout the adjacent suboccipital soft
tissues.
4. Multiple bilateral renal cysts, several which are complex in
appearance, with a few indeterminate and possibly solid lesions
involving the bilateral kidneys as above, indeterminate. Further
evaluation with dedicated renal mass protocol CT and/or MRI
recommended for further evaluation.
5. Mild multilevel degenerative spondylosis without significant
spinal stenosis. Moderate right C6 foraminal narrowing related to
disc bulge and uncovertebral disease. Foraminal to extraforaminal
disc protrusions on the left at L3-4 and on the right at L4-5 as
above.

## 2021-01-13 IMAGING — MR MR THORACIC SPINE WO/W CM
6 of 9 series · 24 of 48 positions shown · IV contrast (10ml Gadavist)
Comparison: Comparison made with previous brain MRIs from
[DATE].

CLINICAL DATA: Follow-up examination for fourth ventricular
ependymoma, status post resection on [DATE]. Evaluate for
possible metastatic disease.

EXAM:
MRI TOTAL SPINE WITHOUT AND WITH CONTRAST
TECHNIQUE: Multisequence MR imaging of the spine from the cervical spine to the
sacrum was performed prior to and following IV contrast
administration for evaluation of spinal metastatic disease.
CONTRAST:  10mL GADAVIST GADOBUTROL 1 MMOL/ML IV SOLN

[Series 16: T1 · sagittal · 5.0mm · 1.88mm/px · 1 of 9 slices shown (1 of 2)]
[im 1/9]
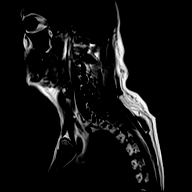

[Series 17: T2 · sagittal · 3.0mm · 1.06mm/px · 3 of 17 slices shown (1 of 2)]
[im 1/17]
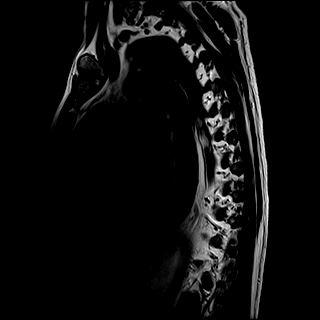
[im 9/17]
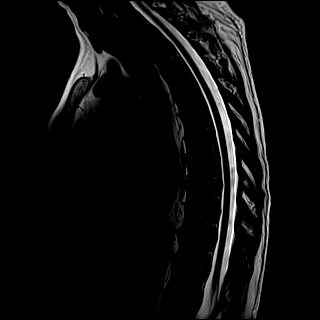
[im 17/17]
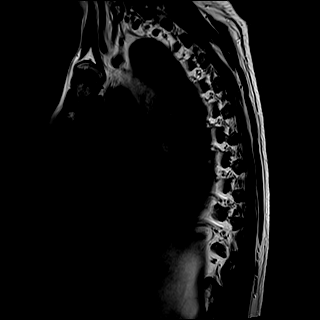

[Series 18: T1 · sagittal · 3.0mm · 1.06mm/px · 4 of 17 slices shown (2 of 2)]
[im 1/17]
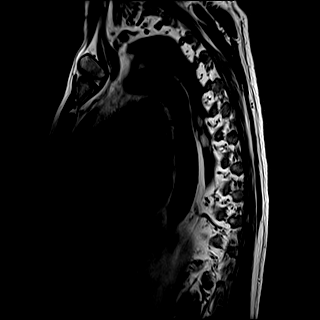
[im 6/17]
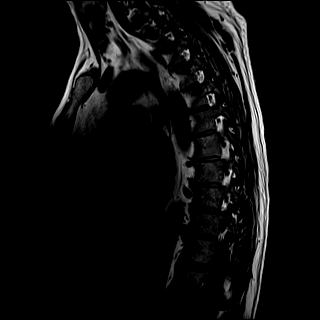
[im 11/17]
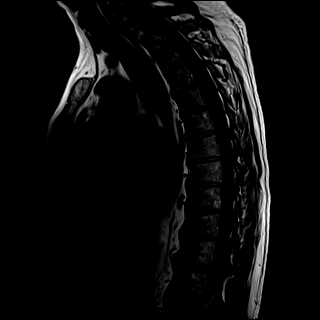
[im 17/17]
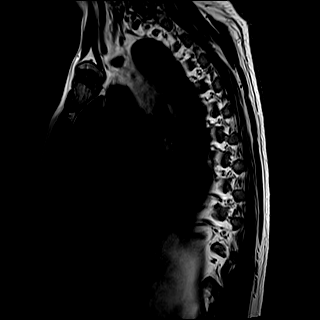

[Series 19: STIR · sagittal · 3.0mm · 0.53mm/px · 4 of 17 slices shown]
[im 1/17]
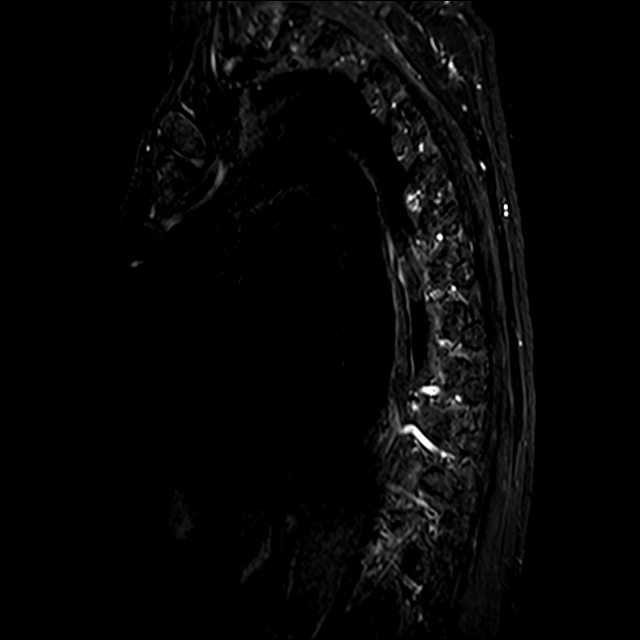
[im 6/17]
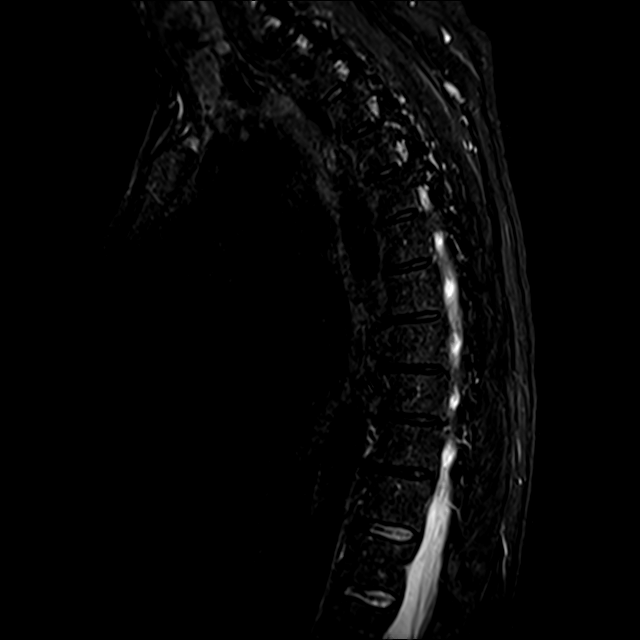
[im 11/17]
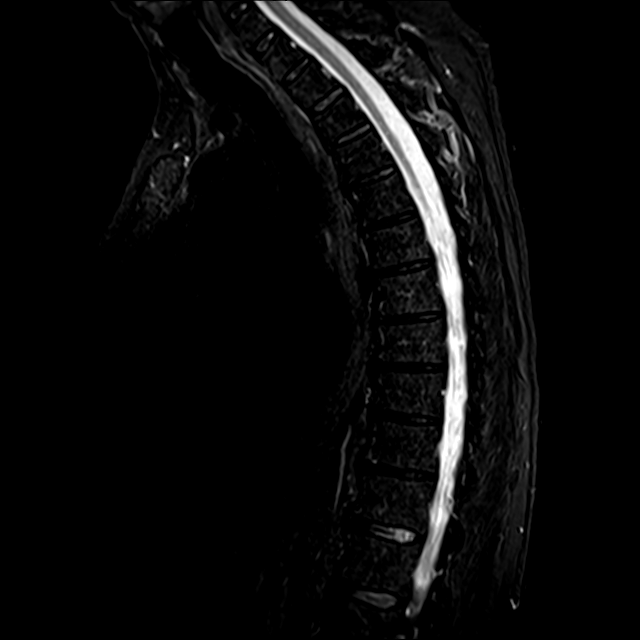
[im 17/17]
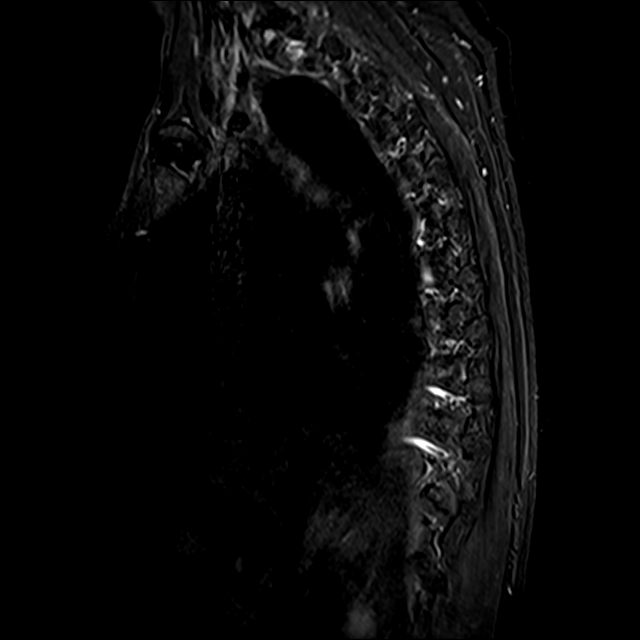

[Series 21: T2 · axial · 4.0mm · 0.59mm/px · z∈[-363,-134]mm · 8 of 39 slices shown (2 of 2)]
[im 1/39]
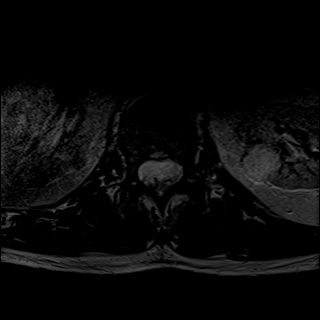
[im 6/39]
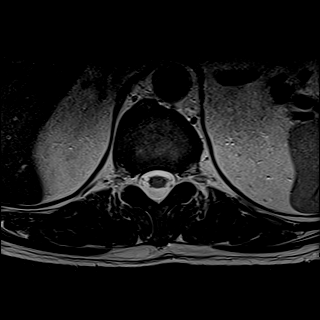
[im 11/39]
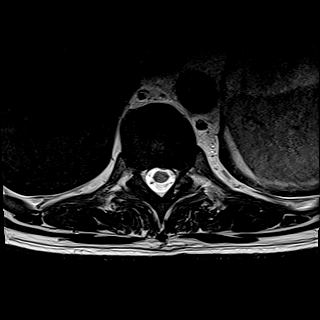
[im 17/39]
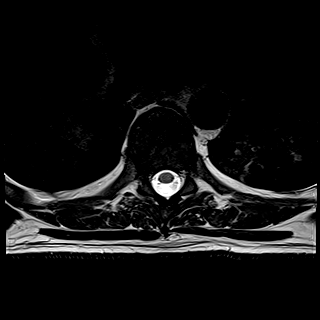
[im 22/39]
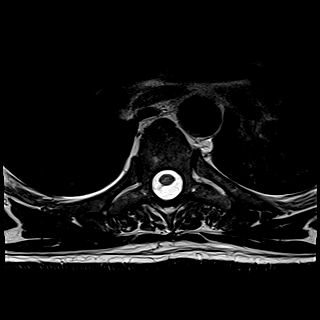
[im 28/39]
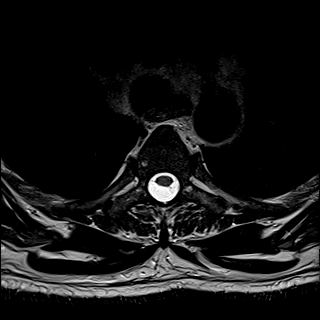
[im 33/39]
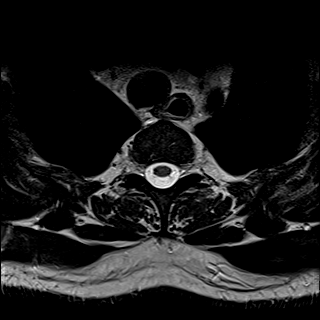
[im 39/39]
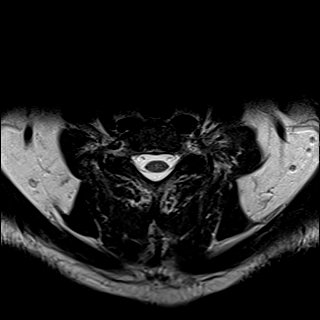

[Series 23: T1 fat-sat post-contrast · sagittal · 3.0mm · 1.06mm/px · 4 of 17 slices shown]
[im 1/17]
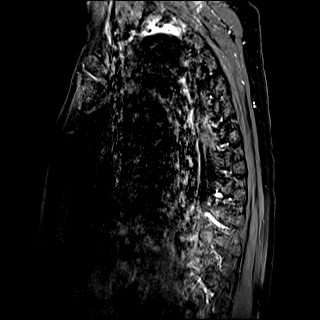
[im 6/17]
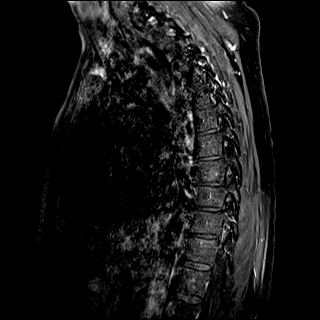
[im 11/17]
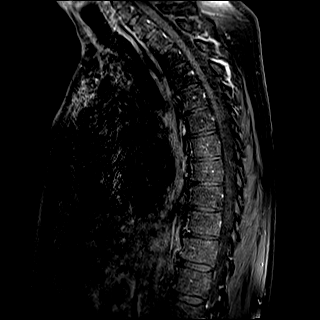
[im 17/17]
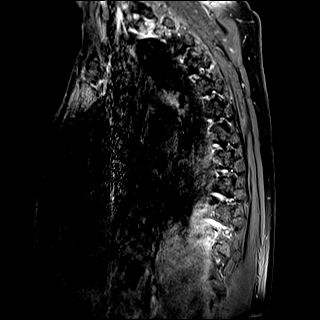

[24 of 48 positions shown; findings below may reference images not displayed]

FINDINGS: MRI CERVICAL SPINE FINDINGS

Alignment: Physiologic with preservation of the normal cervical
lordosis. No listhesis.

Vertebrae: Vertebral body height maintained without acute or chronic
fracture. Bone marrow signal intensity within normal limits.
Subcentimeter benign hemangioma noted within left lateral mass of
C1. No other discrete or worrisome osseous lesions. No abnormal
marrow edema or enhancement.

Cord: Normal signal and morphology. No abnormal enhancement. No
evidence for metastatic disease within the cervical spine or spinal
cord.

Posterior Fossa, vertebral arteries, paraspinal tissues: Extensive
postoperative changes from recent suboccipital craniotomy for fourth
ventricular tumor resection are partially visualized at the skull
base and posterior fossa. Residual heterogeneous collection at the
craniotomy site measures approximately 5.6 x 3.9 cm. Inferior
extension to the level of C3 within the posterior paraspinous soft
tissues (series 3, image 9). Diffuse enhancement throughout this
region likely postoperative in nature. Small amount of enhancement
seen along the dorsal surface of the medulla and/or fourth
ventricular outflow tract, partially visualized (series 7, image 8).
This could be postoperative in nature, but is incompletely assessed
on this exam.

Remainder of the paraspinous soft tissues within normal limits.
Normal flow voids seen within the vertebral arteries bilaterally. 1
cm left thyroid nodule noted, of doubtful significance given size
and patient age, no follow-up imaging recommended (ref: [HOSPITAL]. [DATE]): 143-50).

Disc levels:

Mild noncompressive disc bulging noted at C3-4 through C5-6 without
significant spinal stenosis. Moderate right C6 foraminal narrowing
related to disc bulge and uncovertebral disease (series 5, image
19). No other significant foraminal encroachment within the cervical
spine.

MRI THORACIC SPINE FINDINGS

Alignment: Physiologic with preservation of the normal thoracic
kyphosis. No listhesis.

Vertebrae: Vertebral body height maintained without acute or chronic
fracture. Bone marrow signal intensity mildly heterogeneous but
overall within normal limits. No worrisome osseous lesions. No
abnormal marrow edema or enhancement.

Cord: Normal signal and morphology. No abnormal enhancement. No
evidence for metastatic disease within the thoracic spine.

Paraspinal and other soft tissues: Paraspinous soft tissues
demonstrate no acute finding. 1.6 cm benign appearing cyst noted
within the right hepatic lobe. Multiple scattered cysts noted about
the partially visualized kidneys, a few of which demonstrate
intrinsic T1 hyperintensity, likely proteinaceous and/or hemorrhagic
cyst. Mild atelectatic changes at the left lung base.

Disc levels:

Tiny left paracentral disc protrusion at T9-10 without significant
stenosis (series 21, image 29). Otherwise, no other significant disc
pathology for patient age. No significant spinal stenosis. Foramina
remain patent. No overt neural impingement.

MRI LUMBAR SPINE FINDINGS

Segmentation: Standard. Lowest well-formed disc space labeled the
L5-S1 level.

Alignment: Mild dextroscoliosis. Alignment otherwise normal with
preservation of the normal lumbar lordosis.

Vertebrae: Vertebral body height maintained without acute or chronic
fracture. Bone marrow signal intensity mildly heterogeneous but
overall within normal limits. No worrisome osseous lesions. No
abnormal marrow edema or enhancement.

Conus medullaris: Extends to the L1 level and appears normal. No
abnormal enhancement or evidence for metastatic disease within the
lumbar spine.

Paraspinal and other soft tissues: Paraspinous soft tissues within
normal limits. Multiple scattered cysts noted about the kidneys
bilaterally, few which demonstrate intrinsic T1 hyperintensity,
which could reflect proteinaceous and/or hemorrhagic cyst. There is
a somewhat solid-appearing lesion measuring approximately 1.5 cm at
the posterior aspect of the visualized left kidney (series 4, image
14). An additional indeterminate 2 cm exophytic lesion extends from
the lower pole of the right kidney (series 4, image 15). These
findings are indeterminate and incompletely assessed on this exam.
Remainder of the visualized visceral structures otherwise
unremarkable.

Disc levels:

L1-2:  Unremarkable.

L2-3:  Unremarkable.

L3-4: Mild intervertebral disc space narrowing with diffuse disc
bulge and disc desiccation. Superimposed left foraminal to
extraforaminal disc protrusion contacts the exiting left L3 nerve
root (series 4, image 21). Mild facet hypertrophy. No significant
spinal stenosis. Mild left greater than right L3 foraminal
narrowing.

L4-5: Degenerative intervertebral disc space narrowing with diffuse
disc bulge and disc desiccation. Superimposed right foraminal disc
protrusion closely approximates the exiting right L4 nerve root
(series 1, image 4). Mild facet hypertrophy. Mild narrowing of the
lateral recesses without significant spinal stenosis. Moderate right
with mild left L4 foraminal stenosis.

L5-S1: Degenerative intervertebral disc space narrowing with diffuse
disc bulge and disc desiccation. Mild facet hypertrophy. No spinal
stenosis. Foramina remain patent.
IMPRESSION: 1. Postoperative changes from recent suboccipital craniotomy for
fourth ventricular tumor resection. Small amount of enhancement
along the dorsal surface of the medulla and/or fourth ventricular
outflow tract, partially visualized. While this could be
postoperative in nature, this is incompletely assessed on this exam.
Dedicated MRI of the brain, with and without contrast, recommended
for further evaluation.
2. No evidence for metastatic disease within the cervical, thoracic,
or lumbar spine.
3. 5.6 collection at the craniotomy site with associated
postoperative changes throughout the adjacent suboccipital soft
tissues.
4. Multiple bilateral renal cysts, several which are complex in
appearance, with a few indeterminate and possibly solid lesions
involving the bilateral kidneys as above, indeterminate. Further
evaluation with dedicated renal mass protocol CT and/or MRI
recommended for further evaluation.
5. Mild multilevel degenerative spondylosis without significant
spinal stenosis. Moderate right C6 foraminal narrowing related to
disc bulge and uncovertebral disease. Foraminal to extraforaminal
disc protrusions on the left at L3-4 and on the right at L4-5 as
above.

## 2021-01-13 MED ORDER — GADOBUTROL 1 MMOL/ML IV SOLN
10.0000 mL | Freq: Once | INTRAVENOUS | Status: AC | PRN
  Administered 2021-01-13: 10 mL via INTRAVENOUS

## 2021-02-15 DIAGNOSIS — Z86711 Personal history of pulmonary embolism: Secondary | ICD-10-CM

## 2021-02-15 HISTORY — PX: VENTRICULOPERITONEAL SHUNT: SHX204

## 2021-02-15 HISTORY — DX: Personal history of pulmonary embolism: Z86.711

## 2021-03-18 ENCOUNTER — Other Ambulatory Visit: Payer: Self-pay | Admitting: Physician Assistant

## 2021-03-18 DIAGNOSIS — R1312 Dysphagia, oropharyngeal phase: Secondary | ICD-10-CM

## 2021-03-18 DIAGNOSIS — R059 Cough, unspecified: Secondary | ICD-10-CM

## 2021-04-06 ENCOUNTER — Ambulatory Visit
Admission: RE | Admit: 2021-04-06 | Discharge: 2021-04-06 | Disposition: A | Discharge status: Home / Self Care | Payer: Medicare Other | Source: Ambulatory Visit | Attending: Physician Assistant | Admitting: Physician Assistant

## 2021-04-06 ENCOUNTER — Other Ambulatory Visit: Payer: Self-pay

## 2021-04-06 DIAGNOSIS — R1312 Dysphagia, oropharyngeal phase: Secondary | ICD-10-CM | POA: Insufficient documentation

## 2021-04-06 DIAGNOSIS — R059 Cough, unspecified: Secondary | ICD-10-CM | POA: Diagnosis present

## 2021-04-06 IMAGING — RF DG SWALLOWING FUNCTION
11 of 14 series · 15 of 24 positions shown · non-contrast
Comparison: None.

CLINICAL DATA: Oropharyngeal dysphagia.  Cough, unspecified type

EXAM:
MODIFIED BARIUM SWALLOW
TECHNIQUE: Different consistencies of barium were administered orally to the
patient by the Speech Pathologist. Imaging of the pharynx was
performed in the lateral projection. The radiologist was present in
the fluoroscopy room for this study, providing personal supervision.
FLUOROSCOPY:
Radiation Exposure Index:  13.1 mGy

[Series 1: 5ml thin - trial 1 (via teaspoon) · 1 of 14 frames shown (1 of 2)]
[frame 3/14]
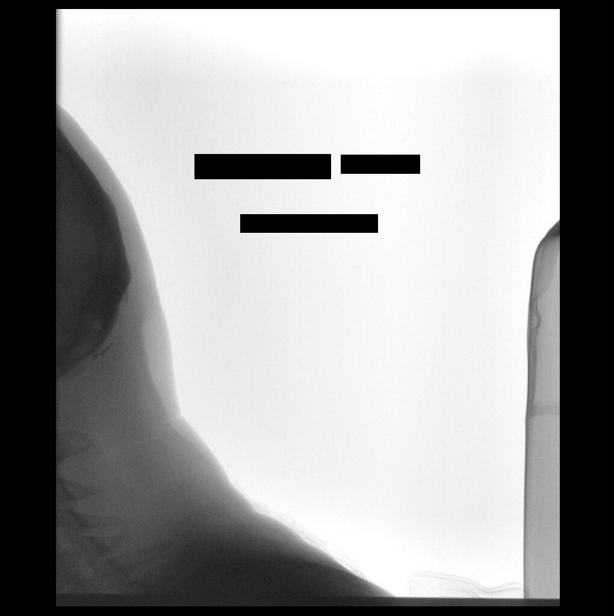

[Series 1: cp_standard · 0.25mm/px · 1 of 1 slices shown]
[im 1/1]
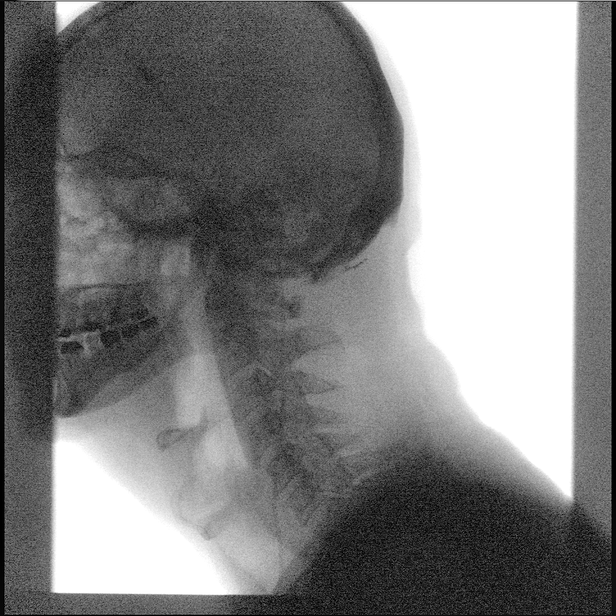

[Series 3: 5ml thin - trial 1 (via teaspoon) · 2 of 153 frames shown (2 of 2)]
[frame 23/153]
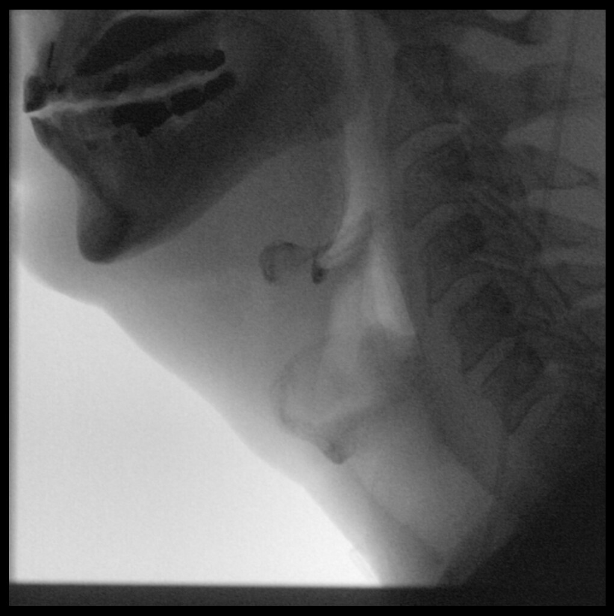
[frame 131/153]
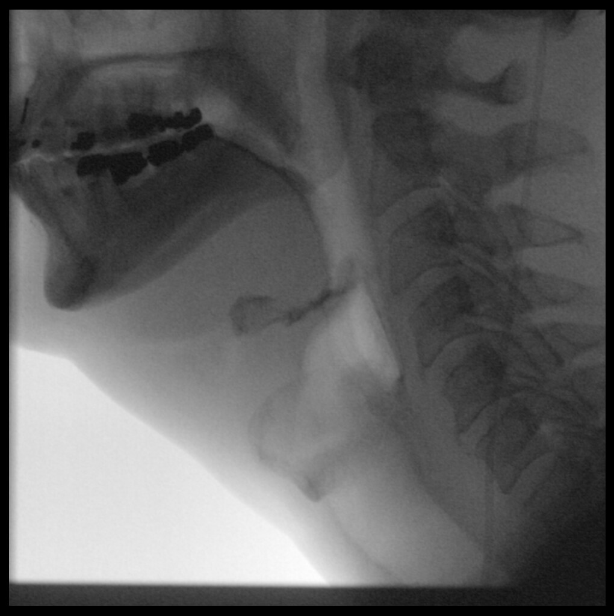

[Series 5: sequential thin · 2 of 318 frames shown (1 of 3)]
[frame 48/318]
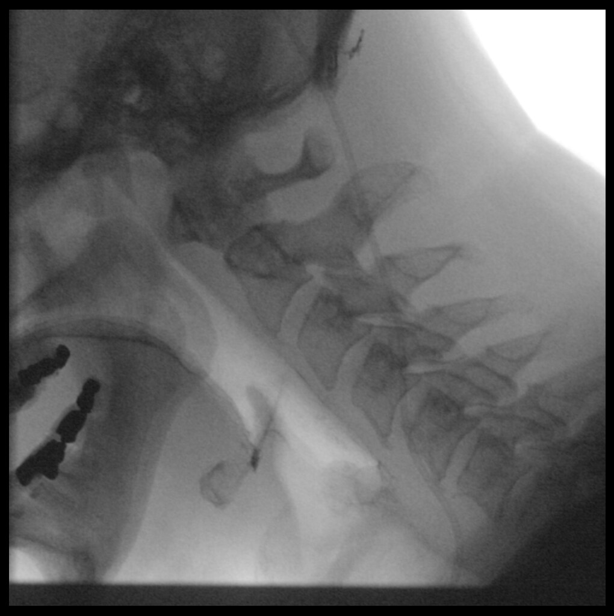
[frame 271/318]
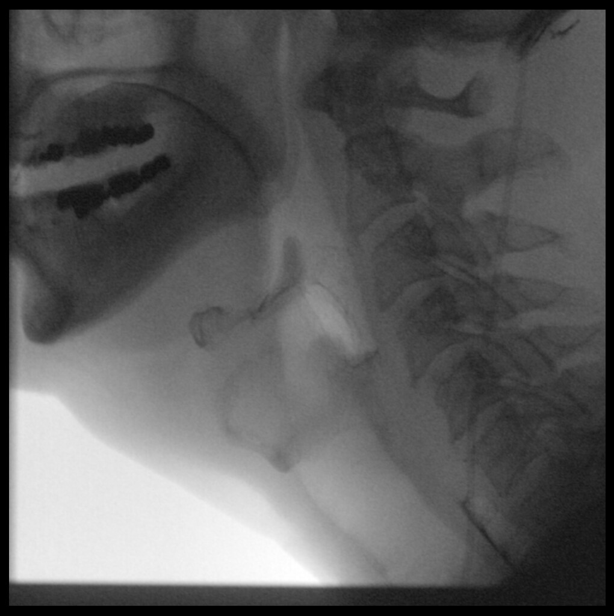

[Series 6: sequential thin · 1 of 298 frames shown (2 of 3)]
[frame 254/298]
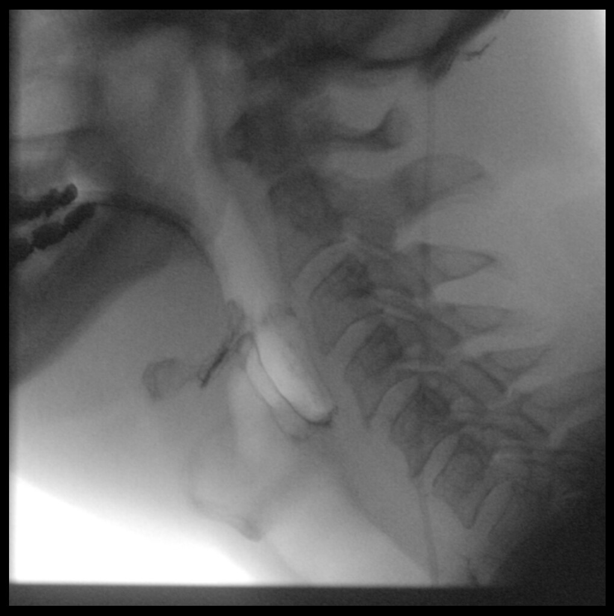

[Series 7: 5ml nectar (via teaspoon) · 1 of 134 frames shown]
[frame 124/134]
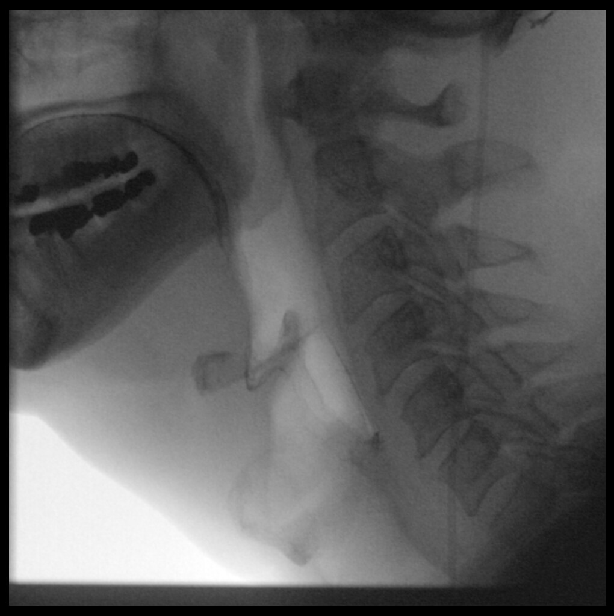

[Series 8: cup sip nectar · 1 of 219 frames shown]
[frame 110/219]
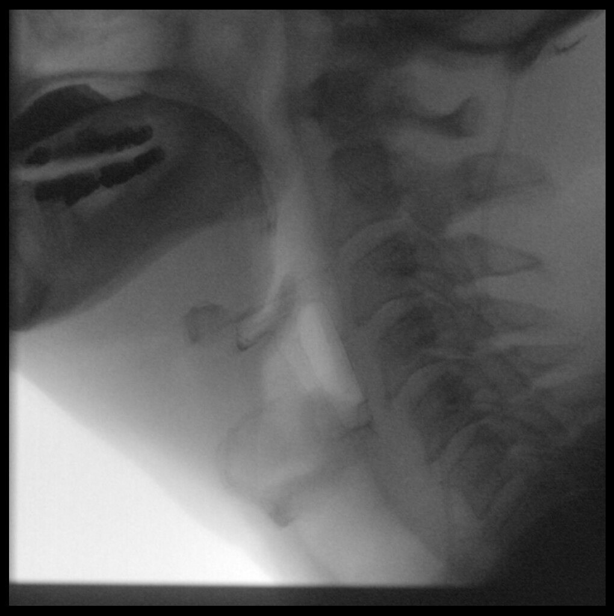

[Series 9: sequential nectar · 2 of 272 frames shown]
[frame 137/272]
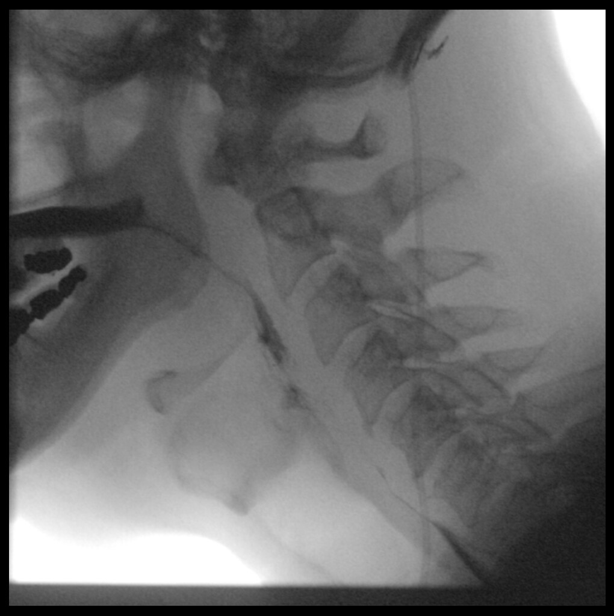
[frame 271/272]
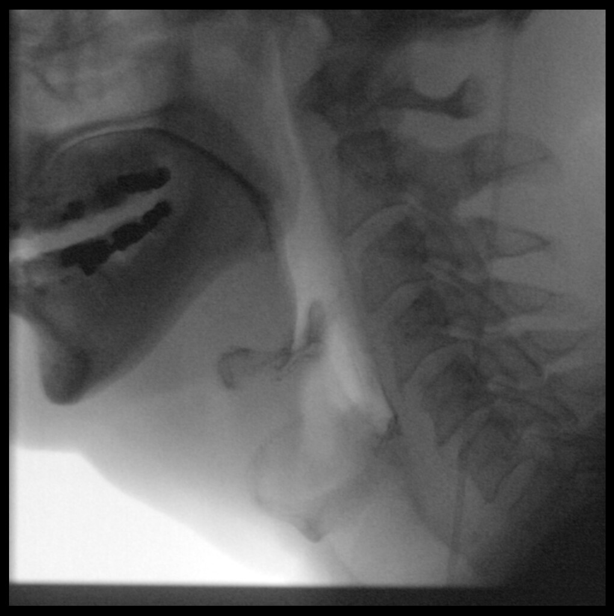

[Series 11: 1/2 shortbread cookie (1 x 1 x .25) coat · 1 of 266 frames shown (1 of 2)]
[frame 40/266]
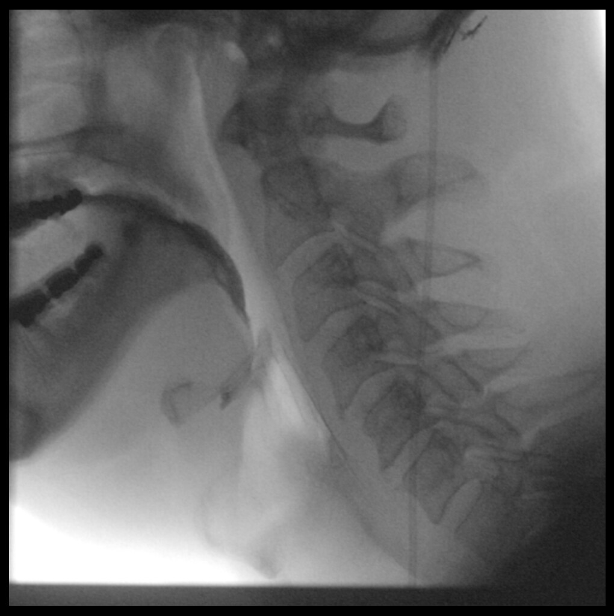

[Series 12: 1/2 shortbread cookie (1 x 1 x .25) coat · 2 of 331 frames shown (2 of 2)]
[frame 50/331]
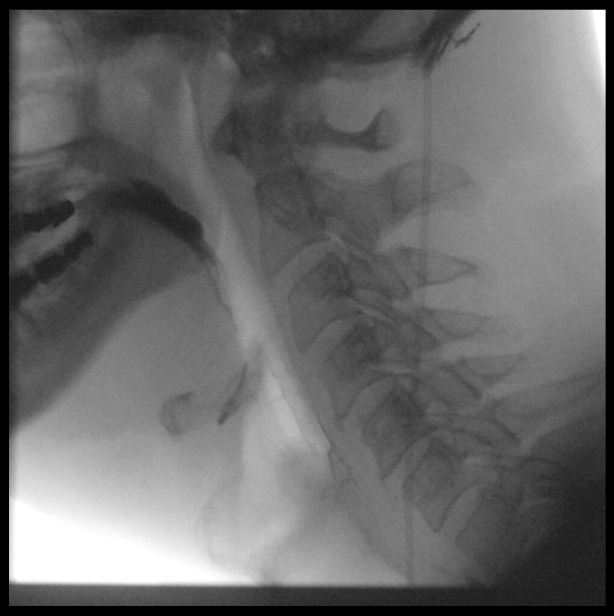
[frame 282/331]
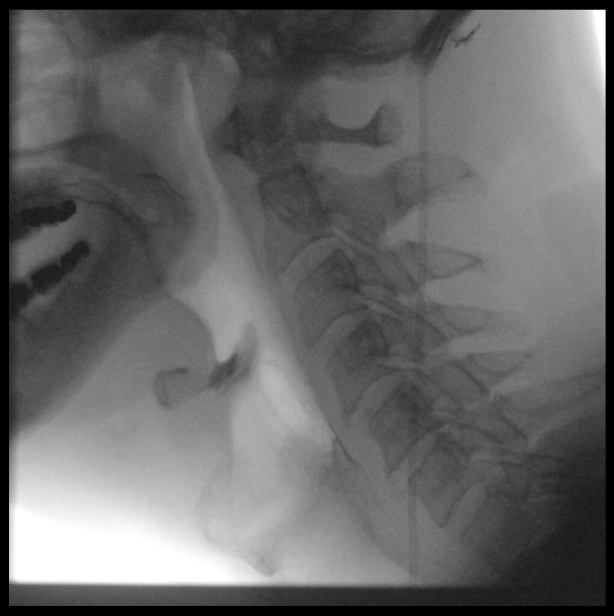

[Series 13: sequential thin · 1 of 436 frames shown (3 of 3)]
[frame 433/436]
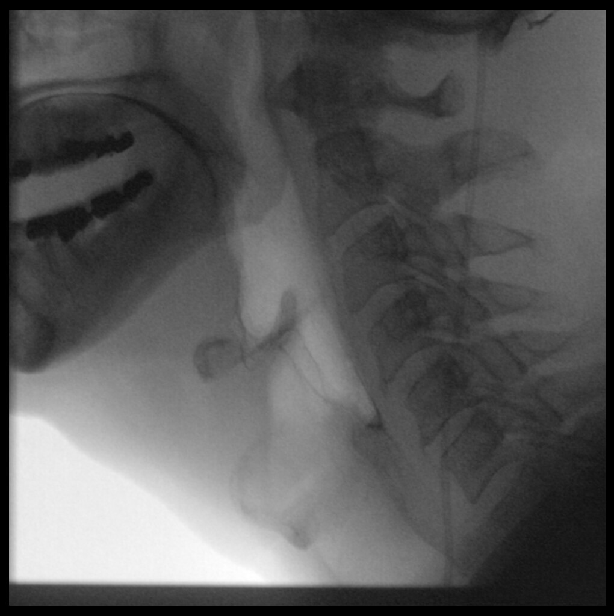

[15 of 24 positions shown; findings below may reference images not displayed]

FINDINGS: No aspiration with all attempted consistencies including thin liquid
barium, nectar thick, and solid.
IMPRESSION: No aspiration.

Please refer to the Speech Pathologists report for complete details
and recommendations.

## 2021-04-06 NOTE — Therapy (Signed)
[facility] [place], [place], [address] Phone: [phone]   Fax:     Modified Barium Swallow  Patient Details  Name: Frank Johnson MRN: [id number] Date of Birth: Apr 09, 1955 No data recorded  Encounter Date: 04/06/2021   End of Session - 04/06/21 1349     Visit Number 1    Number of Visits 1    Date for SLP Re-Evaluation 04/06/21    SLP Start Time 37    SLP Stop Time  1300    SLP Time Calculation (min) 30 min    Activity Tolerance Patient tolerated treatment well             Past Medical History:  Diagnosis Date   Anxiety    Arthritis    Chronic back pain    CKD (chronic kidney disease), stage III ([place])    ED (erectile dysfunction)    Gout    History of kidney stones    HTN (hypertension)    Insomnia    Kidney stone    Left inguinal hernia    Low HDL (under 40)    Overweight    Polycythemia    Pseudophakia of both eyes    Sleep apnea    Uses every night    Past Surgical History:  Procedure Laterality Date   cataracts     COLONOSCOPY     COLONOSCOPY WITH PROPOFOL N/A 09/24/2020   Procedure: COLONOSCOPY WITH PROPOFOL;  Surgeon: Toledo, Benay Pike, MD;  Location: [facility];  Service: Gastroenterology;  Laterality: N/A;   EYE SURGERY     LITHOTRIPSY     RETINAL DETACHMENT SURGERY Left 2020    There were no vitals filed for this visit.   Subjective Assessment - 04/06/21 1315     Subjective "I think sometimes we forgot about the straw."    Patient is accompained by: Family member   spouse   Currently in Pain? No/denies              04/06/21 1300  SLP Visit Information  SLP Received On 04/06/21  Subjective  Subjective Alert, pleasant, cooperative  Pain Assessment  Pain Assessment No/denies pain  General Information  Date of Onset 01/25/22  HPI [name] is a 66 y.o. 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 Readmitted to [facility] on 01/25/21 with meningitis and treated with antibiotics, subsequently had removal of EVD and placement of R frontal VP shunt. CT brain on 1/11 showed a new subdural hemorrhage and shunt was reprogrammed. A CT chest was done on 02/25/21 which revealed right pulmonary artery embolism. Pt had SLP services during acute admission and rehab, including multiple FEES (03/02/21 and 03/06/21) and MBS (03/13/21). At time of d/c pt was advanced to regular diet, thin liquids by single sip (no straws) due to aspiration during consecutive swallows. MBS on 03/13/21 showed mild-moderate oro-pharyngeal dysphagia with sensory deficits, reduced hyolaryngeal closure and pharyngeal weakness.  Type of Study MBS-Modified Barium Swallow Study  Previous Swallow Assessment see HPI  Diet Prior to this Study Regular;Thin liquids  Temperature Spikes Noted No  Respiratory Status Room air  History of Recent Intubation  (during acute stay in January)  Behavior/Cognition Alert;Cooperative;Pleasant mood  Oral Cavity Assessment WFL  Oral Care Completed by SLP No  Vision Functional for self feeding  Self-Feeding Abilities Able to feed self  Patient Positioning Upright in chair  Baseline Vocal Quality Normal  Volitional Cough Strong  Volitional Swallow Able to elicit  Anatomy WFL  Pharyngeal Secretions Not observed secondary MBS  Oral Motor/Sensory Function  Overall Oral Motor/Sensory Function WFL  Oral Preparation/Oral Phase  Oral Phase WFL  Pharyngeal Phase  Pharyngeal Phase WFL  Cervical Esophageal Phase  Cervical Esophageal Phase Impaired  Cervical Esophageal Phase - Solids  Puree WFL  Mechanical Soft Esophageal backflow into cervical esophagus (x1 after consecutive solids (1 bite pudding, 2 bites graham cracker))  Clinical Impression  Clinical Impression Patient presents with improved swallow function compared with most recent objective testing  on 03/13/21. Pt presenting with functional oropharyngeal swallowing, with noted improvements in timing, hyolarygneal elevation/airway closure, and pharyngeal strength. Oral stage is characterized by adequate bolus hold/oral containment, rotary mastication, and timely anterior to posterior transit. Swallow initiation occurs at the level of the base of tongue. Pharyngeal phase is characterized by adequate base of tongue retraction, adequate hyolaryngeal excursion and airway closure, and adequate pharyngeal constriction. Epiglottic deflection is complete; there is no penetration or aspiration with any consistency, even with repeated challenging via consecutive straw sips of thin liquids. There is no abnormal pharyngeal residue. Cervical esophageal phase is noted for adequate amplitude/duration of pharyngoesophageal segment opening. There was one instance of retrograde flow which remained in the cervical esophagus below the level of the PE segment, when consecutive bites of solids were taken. This cleared with wash of thin liquid. Consistencies tested were thin liquids x2 tsps, 1 cup sip, 9 sequential straw sips, nectar x1 tsp, 1 cup sip, 3 sequential cup sips, pudding x1 tsp, mechanical soft (1/2 graham cracker with pudding). Recommend pt continue regular diet with thin liquids, may resume using straws if he choses to do so. Education to pt/wife provided on signs of dysphagia/aspiration and that acute illness, decompensation and/or mental status change may increase his aspiration risk, which appears to be mild overall at this time.  SLP Visit Diagnosis Dysphagia, oropharyngeal phase (R13.12)  Impact on safety and function Mild aspiration risk (education to pt/spouse that aspiration risk increases if pt acutely ill or decompensated)  Swallow Evaluation Recommendations  SLP Diet Recommendations Regular solids;Thin liquid  Liquid Administration via Cup;Straw  Medication Administration Whole meds with liquid   Supervision Patient able to self feed  Compensations Slow rate;Small sips/bites;Minimize environmental distractions;Follow solids with liquid  Postural Changes Seated upright at 90 degrees;Remain semi-upright after after feeds/meals (Comment)  Treatment Plan  Oral Care Recommendations Oral care BID  Treatment Recommendations No treatment recommended at this time  Follow Up Recommendations  (pt/ spouse report he has concluded ST)  Prognosis  Prognosis for Safe Diet Advancement Good  Individuals Consulted  Consulted and Agree with Results and Recommendations Patient;Family member/caregiver  Family Member Consulted spouse  Report Sent to  Referring physician  Progression Toward Goals  Progression toward goals Goals met, education completed, patient discharged from SLP  SLP Time Calculation  SLP Start Time (ACUTE ONLY) 1230  SLP Stop Time (ACUTE ONLY) 1300  SLP Time Calculation (min) (ACUTE ONLY) 30 min  SLP Evaluations  $ SLP Speech Visit 1 Visit  SLP Evaluations  $Outpatient MBS Swallow 1 Procedure      Oropharyngeal dysphagia - Plan: DG SWALLOW FUNC OP MEDICARE SPEECH PATH, DG SWALLOW FUNC OP MEDICARE SPEECH PATH  Cough, unspecified type - Plan: DG SWALLOW FUNC OP MEDICARE SPEECH PATH, DG SWALLOW FUNC OP MEDICARE SPEECH PATH        Problem List Patient Active Problem List  Diagnosis Date Noted   Pseudophakia of right eye 03/28/2019   Combined forms of age-related cataract of both eyes 03/08/2019   Primary insomnia 02/26/2019   Hernia, inguinal, left 10/03/2012   History of urinary stone 10/03/2012   Deneise Lever, MS, [place], [place] 04/06/2021, 1:50 PM  [facility] [place], [place], [address] Phone: [phone]   Fax:     Name: Hattie Aguinaldo MRN: [id number] Date of Birth: 07/02/1955

## 2021-08-04 ENCOUNTER — Ambulatory Visit: Payer: Self-pay | Admitting: Surgery

## 2021-08-04 NOTE — H&P (Signed)
Subjective:  CC: Recurrent left inguinal hernia [K40.91]  HPI:  Frank Johnson is a 66 y.o. male who was referred by Idelle Crouch, MD for evaluation of above. Enlarging and increasing discomfort.   Past Medical History:  has a past medical history of Allergy, Anxiety, Chronic gouty arthritis (04/23/2020), Hypertension, Motion sickness, and Sleep apnea., PE  Past Surgical History:  Past Surgical History: Procedure Laterality Date  COLONOSCOPY  05/26/2017  Adenomatous Polyps: CBF 05/2020  VITREOUS RETINAL SURGERY  01/03/2019  s/p PPV/SB-DR.NATHIANES  had this done twice, both retinas detached  EXTRACTION CATARACT EXTRACAPSULAR W/INSERTION INTRAOCULAR PROSTHESIS Right 03/28/2019  Procedure: R- LRI-ORA Cataract extraction with intraocular lens implant and limbal relaxing incisions;  Surgeon: Arline Asp, MD;  Location: [facility];  Service: Ophthalmology;  Laterality: Right;  LRI  EXTRACTION CATARACT EXTRACAPSULAR W/INSERTION INTRAOCULAR PROSTHESIS Left 05/16/2019  Procedure: L- LRI-ORA Cataract extraction with intraocular lens implant and limbal relaxing incisions;  Surgeon: Arline Asp, MD;  Location: [facility];  Service: Ophthalmology;  Laterality: Left;  LRI  CRANIECTOMY W/EXCISION POSTERIOR FOSSA TUMOR N/A 12/11/2020  Procedure: POSTERIOR FOSSA TUMOR RESECTION;  Surgeon: Malen Gauze, MD;  Location: [facility];  Service: Neurosurgery;  Laterality: N/A;  MICROSURGERY N/A 12/11/2020  Procedure: MICROSURGICAL TECHNIQUES, REQUIRING USE OF OPERATING MICROSCOPE;  Surgeon: Malen Gauze, MD;  Location: [facility];  Service: Neurosurgery;  Laterality: N/A;  INSERTION VENTRICULO-PERITONEAL SHUNT Right 02/23/2021  Procedure: INSERTION VENTRICULO-PERITONEAL SHUNT;  Surgeon: Malen Gauze, MD;  Location: [facility];  Service: Neurosurgery;  Laterality: Right;  COLONOSCOPY  05/01/2012, 12/12/2006  Adenomatous Polyps: CBF 04/2017; Recall Ltr mailed 03/31/2017 (dh)  HERNIA  REPAIR    LITHOTRIPSY     Family History: family history includes Arthritis in his father; Atrial fibrillation (Abnormal heart rhythm sometimes requiring treatment with blood thinners) in his father; Breast cancer in his mother; Diabetes type II in his mother; High blood pressure (Hypertension) in his mother; Myocardial Infarction (Heart attack) in his maternal uncle, maternal uncle, and maternal uncle; No Known Problems in his sister; Prostate cancer in his father.  Social History:  reports that he has never smoked. He has never used smokeless tobacco. He reports that he does not drink alcohol and does not use drugs.  Current Medications: has a current medication list which includes the following prescription(s): alprazolam, apixaban, cyanocobalamin, lisinopril, mirtazapine, ondansetron, zolpidem, and tamsulosin.  Allergies:  Allergies as of 08/04/2021  (No Known Allergies)   ROS:  A 15 point review of systems was performed and pertinent positives and negatives noted in HPI   Objective:    BP 124/84   Pulse 79   Ht 182.9 cm (6')   Wt 85.7 kg (189 lb)   BMI 25.63 kg/m   Constitutional :  Alert, cooperative, no distress Lymphatics/Throat:  Supple, no lymphadenopathy Respiratory:  clear to auscultation bilaterally Cardiovascular:  regular rate and rhythm Gastrointestinal: soft, non-tender; bowel sounds normal; no masses,  no organomegaly. inguinal hernia noted.  large, incarcerated, no overlying skin changes, and left Musculoskeletal: Steady gait and movement Skin: Cool and moist,  Psychiatric: Normal affect, non-agitated, not confused     LABS:  N/a   RADS: N/a Assessment:      Recurrent left inguinal hernia [K40.91]  Plan:    1. Recurrent left inguinal hernia [K40.91]   Discussed the risk of surgery including recurrence, which can be up to 50% in the case of incisional or complex hernias, possible use of prosthetic materials (mesh) and the increased risk  of mesh  infxn if used, bleeding, chronic pain, post-op infxn, post-op SBO or ileus, and possible re-operation to address said risks. The risks of general anesthetic, if used, includes MI, CVA, sudden death or even reaction to anesthetic medications also discussed. Alternatives include continued observation.  Benefits include possible symptom relief, prevention of incarceration, strangulation, enlargement in size over time, and the risk of emergency surgery in the face of strangulation.   Typical post-op recovery time of 3-5 days with 2 weeks of activity restrictions were also discussed.  ED return precautions given for sudden increase in pain, size of hernia with accompanying fever, nausea, and/or vomiting.  The patient verbalized understanding and all questions were answered to the patient's satisfaction.   2. Patient has elected to proceed with surgical treatment. Procedure will be scheduled. Open due to recurrent nature and size of hernia.  Pt will need clearence from neurosurg stating ok to proceed with elective procedures s/p brain surgery.  Also mentioned how he had to stay overnight after procedure due to OSA?  Will make sure anesthesia is aware.  labs/images/medications/previous chart entries reviewed personally and relevant changes/updates noted above.

## 2021-08-04 NOTE — H&P (View-Only) (Signed)
Subjective:  CC: Recurrent left inguinal hernia [K40.91]  HPI:  Frank Johnson is a 66 y.o. male who was referred by Idelle Crouch, MD for evaluation of above. Enlarging and increasing discomfort.   Past Medical History:  has a past medical history of Allergy, Anxiety, Chronic gouty arthritis (04/23/2020), Hypertension, Motion sickness, and Sleep apnea., PE  Past Surgical History:  Past Surgical History: Procedure Laterality Date  COLONOSCOPY  05/26/2017  Adenomatous Polyps: CBF 05/2020  VITREOUS RETINAL SURGERY  01/03/2019  s/p PPV/SB-DR.NATHIANES  had this done twice, both retinas detached  EXTRACTION CATARACT EXTRACAPSULAR W/INSERTION INTRAOCULAR PROSTHESIS Right 03/28/2019  Procedure: R- LRI-ORA Cataract extraction with intraocular lens implant and limbal relaxing incisions;  Surgeon: Arline Asp, MD;  Location: [facility];  Service: Ophthalmology;  Laterality: Right;  LRI  EXTRACTION CATARACT EXTRACAPSULAR W/INSERTION INTRAOCULAR PROSTHESIS Left 05/16/2019  Procedure: L- LRI-ORA Cataract extraction with intraocular lens implant and limbal relaxing incisions;  Surgeon: Arline Asp, MD;  Location: [facility];  Service: Ophthalmology;  Laterality: Left;  LRI  CRANIECTOMY W/EXCISION POSTERIOR FOSSA TUMOR N/A 12/11/2020  Procedure: POSTERIOR FOSSA TUMOR RESECTION;  Surgeon: Malen Gauze, MD;  Location: [facility];  Service: Neurosurgery;  Laterality: N/A;  MICROSURGERY N/A 12/11/2020  Procedure: MICROSURGICAL TECHNIQUES, REQUIRING USE OF OPERATING MICROSCOPE;  Surgeon: Malen Gauze, MD;  Location: [facility];  Service: Neurosurgery;  Laterality: N/A;  INSERTION VENTRICULO-PERITONEAL SHUNT Right 02/23/2021  Procedure: INSERTION VENTRICULO-PERITONEAL SHUNT;  Surgeon: Malen Gauze, MD;  Location: [facility];  Service: Neurosurgery;  Laterality: Right;  COLONOSCOPY  05/01/2012, 12/12/2006  Adenomatous Polyps: CBF 04/2017; Recall Ltr mailed 03/31/2017 (dh)  HERNIA  REPAIR    LITHOTRIPSY     Family History: family history includes Arthritis in his father; Atrial fibrillation (Abnormal heart rhythm sometimes requiring treatment with blood thinners) in his father; Breast cancer in his mother; Diabetes type II in his mother; High blood pressure (Hypertension) in his mother; Myocardial Infarction (Heart attack) in his maternal uncle, maternal uncle, and maternal uncle; No Known Problems in his sister; Prostate cancer in his father.  Social History:  reports that he has never smoked. He has never used smokeless tobacco. He reports that he does not drink alcohol and does not use drugs.  Current Medications: has a current medication list which includes the following prescription(s): alprazolam, apixaban, cyanocobalamin, lisinopril, mirtazapine, ondansetron, zolpidem, and tamsulosin.  Allergies:  Allergies as of 08/04/2021  (No Known Allergies)   ROS:  A 15 point review of systems was performed and pertinent positives and negatives noted in HPI   Objective:    BP 124/84   Pulse 79   Ht 182.9 cm (6')   Wt 85.7 kg (189 lb)   BMI 25.63 kg/m   Constitutional :  Alert, cooperative, no distress Lymphatics/Throat:  Supple, no lymphadenopathy Respiratory:  clear to auscultation bilaterally Cardiovascular:  regular rate and rhythm Gastrointestinal: soft, non-tender; bowel sounds normal; no masses,  no organomegaly. inguinal hernia noted.  large, incarcerated, no overlying skin changes, and left Musculoskeletal: Steady gait and movement Skin: Cool and moist,  Psychiatric: Normal affect, non-agitated, not confused     LABS:  N/a   RADS: N/a Assessment:      Recurrent left inguinal hernia [K40.91]  Plan:    1. Recurrent left inguinal hernia [K40.91]   Discussed the risk of surgery including recurrence, which can be up to 50% in the case of incisional or complex hernias, possible use of prosthetic materials (mesh) and the increased risk  of mesh  infxn if used, bleeding, chronic pain, post-op infxn, post-op SBO or ileus, and possible re-operation to address said risks. The risks of general anesthetic, if used, includes MI, CVA, sudden death or even reaction to anesthetic medications also discussed. Alternatives include continued observation.  Benefits include possible symptom relief, prevention of incarceration, strangulation, enlargement in size over time, and the risk of emergency surgery in the face of strangulation.   Typical post-op recovery time of 3-5 days with 2 weeks of activity restrictions were also discussed.  ED return precautions given for sudden increase in pain, size of hernia with accompanying fever, nausea, and/or vomiting.  The patient verbalized understanding and all questions were answered to the patient's satisfaction.   2. Patient has elected to proceed with surgical treatment. Procedure will be scheduled. Open due to recurrent nature and size of hernia.  Pt will need clearence from neurosurg stating ok to proceed with elective procedures s/p brain surgery.  Also mentioned how he had to stay overnight after procedure due to OSA?  Will make sure anesthesia is aware.  labs/images/medications/previous chart entries reviewed personally and relevant changes/updates noted above.

## 2021-08-12 ENCOUNTER — Inpatient Hospital Stay
Admission: RE | Admit: 2021-08-12 | Discharge: 2021-08-12 | Disposition: A | Discharge status: Home / Self Care | Payer: No Typology Code available for payment source | Source: Ambulatory Visit

## 2021-08-12 NOTE — Patient Instructions (Signed)
Your procedure is scheduled on: 08/21/2021  Report to the Registration Desk on the 1st floor of the [facility]. To find out your arrival time, please call [phone] between 1PM - 3PM on: 08/20/2021  If your arrival time is 6:00 am, do not arrive prior to that time as the [facility] entrance doors do not open until 6:00 am.  REMEMBER: Instructions that are not followed completely may result in serious medical risk, up to and including death; or upon the discretion of your surgeon and anesthesiologist your surgery may need to be rescheduled.  Do not eat food after midnight the night before surgery.  No gum chewing, lozengers or hard candies.  You may however, drink CLEAR liquids up to 2 hours before you are scheduled to arrive for your surgery. Do not drink anything within 2 hours of your scheduled arrival time.  Clear liquids include: - water  - apple juice without pulp - gatorade (not RED colors) - black coffee or tea (Do NOT add milk or creamers to the coffee or tea) Do NOT drink anything that is not on this list.     TAKE THESE MEDICATIONS THE MORNING OF SURGERY WITH A SIP OF WATER: allopurinol  ALPRAZolam  tamsulosin    Follow recommendations from Cardiologist, Pulmonologist or PCP regarding stopping Aspirin.  One week prior to surgery: Stop Anti-inflammatories (NSAIDS) such as Advil, Aleve, Ibuprofen, Motrin, Naproxen, Naprosyn and Aspirin based products such as Excedrin, Goodys Powder, BC Powder and mobic  Stop ANY OVER THE COUNTER supplements until after surgery. You may however, continue to take Tylenol if needed for pain up until the day of surgery.  No Alcohol for 24 hours before or after surgery.  No Smoking including e-cigarettes for 24 hours prior to surgery.  No chewable tobacco products for at least 6 hours prior to surgery.  No nicotine patches on the day of surgery.  Do not use any "recreational" drugs for at least a week prior to your surgery.  Please  be advised that the combination of cocaine and anesthesia may have negative outcomes, up to and including death. If you test positive for cocaine, your surgery will be cancelled.  On the morning of surgery brush your teeth with toothpaste and water, you may rinse your mouth with mouthwash if you wish. Do not swallow any toothpaste or mouthwash.  Use CHG Soap with shower  as directed on instruction sheet.  Do not wear jewelry, make-up, hairpins, clips or nail polish.  Do not wear lotions, powders, or perfumes.   Do not shave body from the neck down 48 hours prior to surgery just in case you cut yourself which could leave a site for infection.  Also, freshly shaved skin may become irritated if using the CHG soap.  Contact lenses, hearing aids and dentures may not be worn into surgery.  Do not bring valuables to the hospital. [facility] is not responsible for any missing/lost belongings or valuables.   Bring your C-PAP to the hospital with you in case you may have to spend the night.   Notify your doctor if there is any change in your medical condition (cold, fever, infection).  Wear comfortable clothing (specific to your surgery type) to the hospital.  After surgery, you can help prevent lung complications by doing breathing exercises.  Take deep breaths and cough every 1-2 hours. Your doctor may order a device called an Incentive Spirometer to help you take deep breaths. When coughing or sneezing, hold a  pillow firmly against your incision with both hands. This is called "splinting." Doing this helps protect your incision. It also decreases belly discomfort.  If you are being admitted to the hospital overnight, leave your suitcase in the car. After surgery it may be brought to your room.  If you are being discharged the day of surgery, you will not be allowed to drive home. You will need a responsible adult (18 years or older) to drive you home and stay with you that night.   If  you are taking public transportation, you will need to have a responsible adult (18 years or older) with you. Please confirm with your physician that it is acceptable to use public transportation.   Please call the [facility] Dept. at [phone] if you have any questions about these instructions.  Surgery Visitation Policy:  Patients undergoing a surgery or procedure may have two family members or support persons with them as long as the person is not COVID-19 positive or experiencing its symptoms.   Inpatient Visitation:    Visiting hours are 7 a.m. to 8 p.m. Up to four visitors are allowed at one time in a patient room, including children. The visitors may rotate out with other people during the day. One designated support person (adult) may remain overnight.

## 2021-08-13 ENCOUNTER — Inpatient Hospital Stay: Admission: RE | Admit: 2021-08-13 | Payer: No Typology Code available for payment source | Source: Ambulatory Visit

## 2021-08-13 HISTORY — DX: Other complications of anesthesia, initial encounter: T88.59XA

## 2021-08-13 HISTORY — DX: Malignant (primary) neoplasm, unspecified: C80.1

## 2021-08-13 HISTORY — DX: Neutropenia, unspecified: D70.9

## 2021-08-13 HISTORY — DX: Malignant neoplasm of brain, unspecified: C71.9

## 2021-08-14 ENCOUNTER — Encounter
Admission: RE | Admit: 2021-08-14 | Discharge: 2021-08-14 | Disposition: A | Discharge status: Home / Self Care | Payer: Medicare Other | Source: Ambulatory Visit | Attending: Surgery | Admitting: Surgery

## 2021-08-14 DIAGNOSIS — Z01818 Encounter for other preprocedural examination: Secondary | ICD-10-CM

## 2021-08-14 NOTE — Patient Instructions (Signed)
Your procedure is scheduled on:08-21-21 Friday Report to the Registration Desk on the 1st floor of the [facility].Then proceed to the 2nd floor Surgery Desk To find out your arrival time, please call [phone] between 1PM - 3PM on:08-20-21 Thursday If your arrival time is 6:00 am, do not arrive prior to that time as the [facility] entrance doors do not open until 6:00 am.  REMEMBER: Instructions that are not followed completely may result in serious medical risk, up to and including death; or upon the discretion of your surgeon and anesthesiologist your surgery may need to be rescheduled.  Do not eat food after midnight the night before surgery.  No gum chewing, lozengers or hard candies.  You may however, drink CLEAR liquids up to 2 hours before you are scheduled to arrive for your surgery. Do not drink anything within 2 hours of your scheduled arrival time.  Clear liquids include: - water  - apple juice without pulp - gatorade (not RED colors) - black coffee or tea (Do NOT add milk or creamers to the coffee or tea) Do NOT drink anything that is not on this list.  Do NOT take any medication the day of surgery  Stop your apixaban (ELIQUIS) 3 days prior to surgery-Last dose will be on 08-17-21 Monday  One week prior to surgery: Stop Anti-inflammatories (NSAIDS) such as meloxicam (MOBIC) ,Advil, Aleve, Ibuprofen, Motrin, Naproxen, Naprosyn and Aspirin based products such as Excedrin, Goodys Powder, BC Powder.You may however, take Tylenol if needed for pain up until the day of surgery.  Stop ANY OVER THE COUNTER supplements/vitamins NOW (08-14-21) until after surgery.  No Alcohol for 24 hours before or after surgery.  No Smoking including e-cigarettes for 24 hours prior to surgery.  No chewable tobacco products for at least 6 hours prior to surgery.  No nicotine patches on the day of surgery.  Do not use any "recreational" drugs for at least a week prior to your surgery.  Please be  advised that the combination of cocaine and anesthesia may have negative outcomes, up to and including death. If you test positive for cocaine, your surgery will be cancelled.  On the morning of surgery brush your teeth with toothpaste and water, you may rinse your mouth with mouthwash if you wish. Do not swallow any toothpaste or mouthwash.  Use CHG Soap as directed on instruction sheet.  Do not wear jewelry, make-up, hairpins, clips or nail polish.  Do not wear lotions, powders, or perfumes.   Do not shave body from the neck down 48 hours prior to surgery just in case you cut yourself which could leave a site for infection.  Also, freshly shaved skin may become irritated if using the CHG soap.  Contact lenses, hearing aids and dentures may not be worn into surgery.  Do not bring valuables to the hospital. [facility] is not responsible for any missing/lost belongings or valuables.   Bring your C-PAP to the hospital with you   Notify your doctor if there is any change in your medical condition (cold, fever, infection).  Wear comfortable clothing (specific to your surgery type) to the hospital.  After surgery, you can help prevent lung complications by doing breathing exercises.  Take deep breaths and cough every 1-2 hours. Your doctor may order a device called an Incentive Spirometer to help you take deep breaths. When coughing or sneezing, hold a pillow firmly against your incision with both hands. This is called "splinting." Doing this helps protect  your incision. It also decreases belly discomfort.  If you are being admitted to the hospital overnight, leave your suitcase in the car. After surgery it may be brought to your room.  If you are being discharged the day of surgery, you will not be allowed to drive home. You will need a responsible adult (18 years or older) to drive you home and stay with you that night.   If you are taking public transportation, you will need to have  a responsible adult (18 years or older) with you. Please confirm with your physician that it is acceptable to use public transportation.   Please call the [facility] Dept. at [phone] if you have any questions about these instructions.  Surgery Visitation Policy:  Patients undergoing a surgery or procedure may have two family members or support persons with them as long as the person is not COVID-19 positive or experiencing its symptoms.

## 2021-08-17 ENCOUNTER — Encounter
Admission: RE | Admit: 2021-08-17 | Discharge: 2021-08-17 | Disposition: A | Discharge status: Home / Self Care | Payer: Medicare Other | Source: Ambulatory Visit | Attending: Surgery | Admitting: Surgery

## 2021-08-17 DIAGNOSIS — Z01818 Encounter for other preprocedural examination: Secondary | ICD-10-CM | POA: Diagnosis present

## 2021-08-20 MED ORDER — CEFAZOLIN SODIUM-DEXTROSE 2-4 GM/100ML-% IV SOLN
2.0000 g | INTRAVENOUS | Status: AC
  Administered 2021-08-21: 2 g via INTRAVENOUS

## 2021-08-20 MED ORDER — CELECOXIB 200 MG PO CAPS: 200.0000 mg | ORAL_CAPSULE | ORAL | Status: AC

## 2021-08-20 MED ORDER — GABAPENTIN 300 MG PO CAPS: 300.0000 mg | ORAL_CAPSULE | ORAL | Status: AC

## 2021-08-20 MED ORDER — LACTATED RINGERS IV SOLN: INTRAVENOUS | Status: DC

## 2021-08-20 MED ORDER — CHLORHEXIDINE GLUCONATE CLOTH 2 % EX PADS: 6.0000 | MEDICATED_PAD | Freq: Once | CUTANEOUS | Status: DC

## 2021-08-20 MED ORDER — CHLORHEXIDINE GLUCONATE 0.12 % MT SOLN: 15.0000 mL | Freq: Once | OROMUCOSAL | Status: AC

## 2021-08-20 MED ORDER — ACETAMINOPHEN 500 MG PO TABS: 1000.0000 mg | ORAL_TABLET | ORAL | Status: AC

## 2021-08-20 MED ORDER — ORAL CARE MOUTH RINSE: 15.0000 mL | Freq: Once | OROMUCOSAL | Status: AC

## 2021-08-20 MED ORDER — FAMOTIDINE 20 MG PO TABS: 20.0000 mg | ORAL_TABLET | Freq: Once | ORAL | Status: AC

## 2021-08-21 ENCOUNTER — Ambulatory Visit: Payer: Medicare Other | Admitting: Urgent Care

## 2021-08-21 ENCOUNTER — Ambulatory Visit
Admission: RE | Admit: 2021-08-21 | Discharge: 2021-08-21 | Disposition: A | Discharge status: Home / Self Care | Payer: Medicare Other | Attending: Surgery | Admitting: Surgery

## 2021-08-21 ENCOUNTER — Encounter
Admission: RE | Disposition: A | Discharge status: Home / Self Care | Payer: Self-pay | Source: Home / Self Care | Attending: Surgery

## 2021-08-21 ENCOUNTER — Other Ambulatory Visit: Payer: Self-pay

## 2021-08-21 ENCOUNTER — Encounter: Payer: Self-pay | Admitting: Surgery

## 2021-08-21 DIAGNOSIS — K4031 Unilateral inguinal hernia, with obstruction, without gangrene, recurrent: Secondary | ICD-10-CM | POA: Diagnosis not present

## 2021-08-21 DIAGNOSIS — K4091 Unilateral inguinal hernia, without obstruction or gangrene, recurrent: Secondary | ICD-10-CM

## 2021-08-21 HISTORY — PX: INGUINAL HERNIA REPAIR: SHX194

## 2021-08-21 SURGERY — REPAIR, HERNIA, INGUINAL, ADULT
Anesthesia: General | Site: Groin | Laterality: Left

## 2021-08-21 MED ORDER — DOCUSATE SODIUM 100 MG PO CAPS: 100.0000 mg | ORAL_CAPSULE | Freq: Two times a day (BID) | ORAL | 0 refills | Status: AC | PRN

## 2021-08-21 MED ORDER — MIDAZOLAM HCL 2 MG/2ML IJ SOLN
INTRAMUSCULAR | Status: DC | PRN
  Administered 2021-08-21: 2 mg via INTRAVENOUS

## 2021-08-21 MED ORDER — FENTANYL CITRATE (PF) 100 MCG/2ML IJ SOLN: 25.0000 ug | INTRAMUSCULAR | Status: DC | PRN

## 2021-08-21 MED ORDER — BUPIVACAINE-EPINEPHRINE (PF) 0.5% -1:200000 IJ SOLN
INTRAMUSCULAR | Status: AC
  Filled 2021-08-21: qty 30

## 2021-08-21 MED ORDER — FAMOTIDINE 20 MG PO TABS
ORAL_TABLET | ORAL | Status: AC
  Administered 2021-08-21: 20 mg via ORAL
  Filled 2021-08-21: qty 1

## 2021-08-21 MED ORDER — BUPIVACAINE-EPINEPHRINE (PF) 0.5% -1:200000 IJ SOLN
INTRAMUSCULAR | Status: DC | PRN
  Administered 2021-08-21: 30 mL

## 2021-08-21 MED ORDER — 0.9 % SODIUM CHLORIDE (POUR BTL) OPTIME
TOPICAL | Status: DC | PRN
  Administered 2021-08-21: 500 mL

## 2021-08-21 MED ORDER — DEXMEDETOMIDINE (PRECEDEX) IN NS 20 MCG/5ML (4 MCG/ML) IV SYRINGE
PREFILLED_SYRINGE | INTRAVENOUS | Status: DC | PRN
  Administered 2021-08-21 (×3): 8 ug via INTRAVENOUS
  Administered 2021-08-21: 4 ug via INTRAVENOUS

## 2021-08-21 MED ORDER — MIDAZOLAM HCL 2 MG/2ML IJ SOLN
INTRAMUSCULAR | Status: AC
  Filled 2021-08-21: qty 2

## 2021-08-21 MED ORDER — CELECOXIB 200 MG PO CAPS
ORAL_CAPSULE | ORAL | Status: AC
  Administered 2021-08-21: 200 mg via ORAL
  Filled 2021-08-21: qty 1

## 2021-08-21 MED ORDER — PROPOFOL 1000 MG/100ML IV EMUL
INTRAVENOUS | Status: AC
  Filled 2021-08-21: qty 100

## 2021-08-21 MED ORDER — CHLORHEXIDINE GLUCONATE 0.12 % MT SOLN
OROMUCOSAL | Status: AC
  Administered 2021-08-21: 15 mL via OROMUCOSAL
  Filled 2021-08-21: qty 15

## 2021-08-21 MED ORDER — ACETAMINOPHEN 500 MG PO TABS
ORAL_TABLET | ORAL | Status: AC
  Administered 2021-08-21: 1000 mg via ORAL
  Filled 2021-08-21: qty 2

## 2021-08-21 MED ORDER — ACETAMINOPHEN 325 MG PO TABS: 650.0000 mg | ORAL_TABLET | Freq: Three times a day (TID) | ORAL | 0 refills | Status: AC | PRN

## 2021-08-21 MED ORDER — ONDANSETRON HCL 4 MG/2ML IJ SOLN
INTRAMUSCULAR | Status: DC | PRN
  Administered 2021-08-21: 4 mg via INTRAVENOUS

## 2021-08-21 MED ORDER — FENTANYL CITRATE (PF) 100 MCG/2ML IJ SOLN
INTRAMUSCULAR | Status: DC | PRN
  Administered 2021-08-21 (×2): 50 ug via INTRAVENOUS

## 2021-08-21 MED ORDER — PHENYLEPHRINE 80 MCG/ML (10ML) SYRINGE FOR IV PUSH (FOR BLOOD PRESSURE SUPPORT)
PREFILLED_SYRINGE | INTRAVENOUS | Status: AC
  Filled 2021-08-21: qty 10

## 2021-08-21 MED ORDER — PROMETHAZINE HCL 25 MG/ML IJ SOLN: 6.2500 mg | INTRAMUSCULAR | Status: DC | PRN

## 2021-08-21 MED ORDER — FENTANYL CITRATE (PF) 100 MCG/2ML IJ SOLN
INTRAMUSCULAR | Status: AC
  Filled 2021-08-21: qty 2

## 2021-08-21 MED ORDER — LIDOCAINE HCL (CARDIAC) PF 100 MG/5ML IV SOSY
PREFILLED_SYRINGE | INTRAVENOUS | Status: DC | PRN
  Administered 2021-08-21: 50 mg via INTRAVENOUS

## 2021-08-21 MED ORDER — PROPOFOL 10 MG/ML IV BOLUS
INTRAVENOUS | Status: DC | PRN
  Administered 2021-08-21: 120 mg via INTRAVENOUS

## 2021-08-21 MED ORDER — KETAMINE HCL 50 MG/5ML IJ SOSY
PREFILLED_SYRINGE | INTRAMUSCULAR | Status: AC
  Filled 2021-08-21: qty 5

## 2021-08-21 MED ORDER — BUPIVACAINE LIPOSOME 1.3 % IJ SUSP
INTRAMUSCULAR | Status: AC
  Filled 2021-08-21: qty 20

## 2021-08-21 MED ORDER — PROPOFOL 500 MG/50ML IV EMUL
INTRAVENOUS | Status: DC | PRN
  Administered 2021-08-21: 150 ug/kg/min via INTRAVENOUS

## 2021-08-21 MED ORDER — PHENYLEPHRINE HCL (PRESSORS) 10 MG/ML IV SOLN
INTRAVENOUS | Status: DC | PRN
  Administered 2021-08-21 (×2): 80 ug via INTRAVENOUS

## 2021-08-21 MED ORDER — KETAMINE HCL 10 MG/ML IJ SOLN
INTRAMUSCULAR | Status: DC | PRN
  Administered 2021-08-21: 20 mg via INTRAVENOUS
  Administered 2021-08-21: 10 mg via INTRAVENOUS

## 2021-08-21 MED ORDER — DEXAMETHASONE SODIUM PHOSPHATE 10 MG/ML IJ SOLN
INTRAMUSCULAR | Status: DC | PRN
  Administered 2021-08-21: 10 mg via INTRAVENOUS

## 2021-08-21 MED ORDER — IBUPROFEN 800 MG PO TABS: 800.0000 mg | ORAL_TABLET | Freq: Three times a day (TID) | ORAL | 0 refills | Status: AC | PRN

## 2021-08-21 MED ORDER — PROPOFOL 10 MG/ML IV BOLUS
INTRAVENOUS | Status: AC
  Filled 2021-08-21: qty 20

## 2021-08-21 MED ORDER — BUPIVACAINE LIPOSOME 1.3 % IJ SUSP
INTRAMUSCULAR | Status: DC | PRN
  Administered 2021-08-21: 20 mL

## 2021-08-21 MED ORDER — HYDROCODONE-ACETAMINOPHEN 5-325 MG PO TABS: 1.0000 | ORAL_TABLET | Freq: Four times a day (QID) | ORAL | 0 refills | Status: DC | PRN

## 2021-08-21 MED ORDER — CEFAZOLIN SODIUM-DEXTROSE 2-4 GM/100ML-% IV SOLN
INTRAVENOUS | Status: AC
  Filled 2021-08-21: qty 100

## 2021-08-21 MED ORDER — GABAPENTIN 300 MG PO CAPS
ORAL_CAPSULE | ORAL | Status: AC
  Administered 2021-08-21: 300 mg via ORAL
  Filled 2021-08-21: qty 1

## 2021-08-21 SURGICAL SUPPLY — 41 items
ADH SKN CLS APL DERMABOND .7 (GAUZE/BANDAGES/DRESSINGS) ×1
APL PRP STRL LF DISP 70% ISPRP (MISCELLANEOUS) ×1
BLADE CLIPPER SURG (BLADE) ×2 IMPLANT
BLADE SURG 15 STRL LF DISP TIS (BLADE) ×1 IMPLANT
BLADE SURG 15 STRL SS (BLADE) ×2
CHLORAPREP W/TINT 26 (MISCELLANEOUS) ×2 IMPLANT
DERMABOND ADVANCED (GAUZE/BANDAGES/DRESSINGS) ×1
DERMABOND ADVANCED .7 DNX12 (GAUZE/BANDAGES/DRESSINGS) ×1 IMPLANT
DRAIN PENROSE 12X.25 LTX STRL (MISCELLANEOUS) ×1 IMPLANT
DRAPE LAPAROTOMY 100X77 ABD (DRAPES) ×2 IMPLANT
ELECT REM PT RETURN 9FT ADLT (ELECTROSURGICAL) ×2
ELECTRODE REM PT RTRN 9FT ADLT (ELECTROSURGICAL) ×1 IMPLANT
GAUZE 4X4 16PLY ~~LOC~~+RFID DBL (SPONGE) ×2 IMPLANT
GLOVE BIOGEL PI IND STRL 7.0 (GLOVE) ×1 IMPLANT
GLOVE BIOGEL PI INDICATOR 7.0 (GLOVE) ×1
GLOVE SURG SYN 6.5 ES PF (GLOVE) ×4 IMPLANT
GLOVE SURG SYN 6.5 PF PI (GLOVE) ×2 IMPLANT
GOWN STRL REUS W/ TWL LRG LVL3 (GOWN DISPOSABLE) ×2 IMPLANT
GOWN STRL REUS W/TWL LRG LVL3 (GOWN DISPOSABLE) ×4
LABEL OR SOLS (LABEL) ×1 IMPLANT
MANIFOLD NEPTUNE II (INSTRUMENTS) ×2 IMPLANT
MESH PARIETEX PROGRIP LEFT (Mesh General) ×1 IMPLANT
NEEDLE HYPO 22GX1.5 SAFETY (NEEDLE) ×4 IMPLANT
NS IRRIG 500ML POUR BTL (IV SOLUTION) ×2 IMPLANT
PACK BASIN MINOR ARMC (MISCELLANEOUS) ×2 IMPLANT
SUT ETHIBOND NAB MO 7 #0 18IN (SUTURE) ×2 IMPLANT
SUT MNCRL 4-0 (SUTURE) ×2
SUT MNCRL 4-0 27XMFL (SUTURE) ×1
SUT SILK 2 0 TIES 10X30 (SUTURE) ×1 IMPLANT
SUT SILK 3 0 12 30 (SUTURE) IMPLANT
SUT SILK 3 0 SH 30 (SUTURE) IMPLANT
SUT VIC AB 2-0 CT2 27 (SUTURE) ×2 IMPLANT
SUT VIC AB 3-0 SH 27 (SUTURE) ×4
SUT VIC AB 3-0 SH 27X BRD (SUTURE) ×1 IMPLANT
SUTURE MNCRL 4-0 27XMF (SUTURE) ×1 IMPLANT
SYR 10ML LL (SYRINGE) ×2 IMPLANT
SYR 20ML LL LF (SYRINGE) ×2 IMPLANT
SYR BULB IRRIG 60ML STRL (SYRINGE) ×2 IMPLANT
TOWEL OR 17X26 4PK STRL BLUE (TOWEL DISPOSABLE) ×2 IMPLANT
WATER STERILE IRR 1000ML POUR (IV SOLUTION) ×1 IMPLANT
WATER STERILE IRR 500ML POUR (IV SOLUTION) ×1 IMPLANT

## 2021-08-21 NOTE — Anesthesia Preprocedure Evaluation (Signed)
Anesthesia Evaluation  Patient identified by MRN, date of birth, ID band Patient awake    Reviewed: Allergy & Precautions, NPO status , Patient's Chart, lab work & pertinent test results  History of Anesthesia Complications (+) PONV and history of anesthetic complications  Airway Mallampati: III  TM Distance: >3 FB Neck ROM: Full    Dental  (+) Poor Dentition   Pulmonary neg shortness of breath, sleep apnea and Continuous Positive Airway Pressure Ventilation , neg COPD, neg recent URI, PE   breath sounds clear to auscultation- rhonchi (-) wheezing      Cardiovascular hypertension, Pt. 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    GI/Hepatic negative GI ROS, Neg liver ROS,   Endo/Other  negative endocrine ROSneg diabetes  Renal/GU CRFRenal disease (hx of nephrolithiasis)     Musculoskeletal  (+) Arthritis ,   Abdominal (+) + obese,   Peds  Hematology negative hematology ROS (+)   Anesthesia Other Findings Past Medical History: No date: Anxiety No date: Arthritis No date: Chronic back pain No date: CKD (chronic kidney disease), stage III (HCC) No date: ED (erectile dysfunction) No date: Gout No date: History of kidney stones No date: HTN (hypertension) No date: Insomnia No date: Kidney stone No date: Left inguinal hernia No date: Low HDL (under 40) No date: Overweight No date: Polycythemia No date: Pseudophakia of both eyes No date: Sleep apnea     Comment:  Uses every night   Reproductive/Obstetrics                             Anesthesia Physical  Anesthesia Plan  ASA: 2  Anesthesia Plan: General   Post-op Pain Management:     Induction: Intravenous  PONV Risk Score and Plan: 1 and Propofol infusion and TIVA  Airway Management Planned: LMA and Oral ETT  Additional Equipment:   Intra-op Plan:   Post-operative Plan:   Informed Consent: I have reviewed the patients History and Physical, chart, labs and discussed the procedure including the risks, benefits and alternatives for the proposed anesthesia with the patient or authorized representative who has indicated his/her understanding and acceptance.     Dental advisory given  Plan Discussed with: CRNA and Anesthesiologist  Anesthesia Plan Comments:         Anesthesia Quick Evaluation

## 2021-08-21 NOTE — Discharge Instructions (Addendum)
Hernia repair, Care After This sheet gives you information about how to care for yourself after your procedure. Your health care provider may also give you more specific instructions. If you have problems or questions, contact your health care provider. What can I expect after the procedure? After your procedure, it is common to have the following: Pain in your abdomen, especially in the incision areas. You will be given medicine to control the pain. Tiredness. This is a normal part of the recovery process. Your energy level will return to normal over the next several weeks. Changes in your bowel movements, such as constipation or needing to go more often. Talk with your health care provider about how to manage this. Follow these instructions at home: [place]  tylenol and advil as needed for discomfort.  Please alternate between the two every four hours as needed for pain.    Use narcotics, if prescribed, only when tylenol and motrin is not enough to control pain.  325-'650mg'$  every 8hrs to max of '3000mg'$ /24hrs (including the '325mg'$  in every norco dose) for the tylenol.    Advil up to '800mg'$  per dose every 8hrs as needed for pain.   PLEASE RECORD NUMBER OF PILLS TAKEN UNTIL NEXT FOLLOW UP APPT.  THIS WILL HELP DETERMINE HOW READY YOU ARE TO BE RELEASED FROM ANY ACTIVITY RESTRICTIONS Do not drive or use heavy machinery while taking prescription pain medicine. Do not drink alcohol while taking prescription pain medicine.  Incision care    Follow instructions from your health care provider about how to take care of your incision areas. Make sure you: Keep your incisions clean and dry. Wash your hands with soap and water before and after applying medicine to the areas, and before and after changing your bandage (dressing). If soap and water are not available, use hand sanitizer. Change your dressing as told by your health care provider. Leave stitches (sutures), skin glue,  or adhesive strips in place. These skin closures may need to stay in place for 2 weeks or longer. If adhesive strip edges start to loosen and curl up, you may trim the loose edges. Do not remove adhesive strips completely unless your health care provider tells you to do that. Do not wear tight clothing over the incisions. Tight clothing may rub and irritate the incision areas, which may cause the incisions to open. Do not take baths, swim, or use a hot tub until your health care provider approves. OK TO SHOWER IN 24HRS.   Check your incision area every day for signs of infection. Check for: More redness, swelling, or pain. More fluid or blood. Warmth. Pus or a bad smell. Activity Avoid lifting anything that is heavier than 10 lb (4.5 kg) for 2 weeks or until your health care provider says it is okay. No pushing/pulling greater than 30lbs You may resume normal activities as told by your health care provider. Ask your health care provider what activities are safe for you. Take rest breaks during the day as needed. Eating and drinking Follow instructions from your health care provider about what you can eat after surgery. To prevent or treat constipation while you are taking prescription pain medicine, your health care provider may recommend that you: Drink enough fluid to keep your urine clear or pale yellow. Take over-the-counter or prescription medicines. Eat foods that are high in fiber, such as fresh fruits and vegetables, whole grains, and beans. Limit foods that are high in fat and processed sugars,  such as fried and sweet foods. General instructions Ask your health care provider when you will need an appointment to get your sutures or staples removed. Keep all follow-up visits as told by your health care provider. This is important. Contact a health care provider if: You have more redness, swelling, or pain around your incisions. You have more fluid or blood coming from the  incisions. Your incisions feel warm to the touch. You have pus or a bad smell coming from your incisions or your dressing. You have a fever. You have an incision that breaks open (edges not staying together) after sutures or staples have been removed. You develop a rash. You have chest pain or difficulty breathing. You have pain or swelling in your legs. You feel light-headed or you faint. Your abdomen swells (becomes distended). You have nausea or vomiting. You have blood in your stool (feces). This information is not intended to replace advice given to you by your health care provider. Make sure you discuss any questions you have with your health care provider. Document Released: 08/21/2004 Document Revised: 10/21/2017 Document Reviewed: 11/03/2015 Elsevier Interactive Patient Education  2019 [place]   The drugs that you were given will stay in your system until tomorrow so for the next 24 hours you should not:  Drive an automobile Make any legal decisions Drink any alcoholic beverage   You may resume regular meals tomorrow.  Today it is better to start with liquids and gradually work up to solid foods.  You may eat anything you prefer, but it is better to start with liquids, then soup and crackers, and gradually work up to solid foods.   Please notify your doctor immediately if you have any unusual bleeding, trouble breathing, redness and pain at the surgery site, drainage, fever, or pain not relieved by medication.    Additional Instructions:      Please contact your physician with any problems or Same Day Surgery at [phone], Monday through Friday 6 am to 4 pm, or [facility] [place] at [facility] number at [phone].

## 2021-08-21 NOTE — Op Note (Signed)
Preoperative diagnosis: Left recurrent incarcerated inguinal Hernia.  Postoperative diagnosis: Left recurrent incarcerated inguinal Hernia  Procedure:  Open left inguinal hernia repair with mesh  Anesthesia: General, LMA  Surgeon: Dr. Lysle Pearl  Wound Classification: Clean  Specimen: none  Complications: None  Estimated Blood Loss: 63m   Indications:  Patient is a 66y.o. male developed a symptomatic left inguinal hernia. Repair was indicated to avoid complications of incarceration, obstruction and pain, and a prosthetic mesh repair was elected.  Findings: 1. Vas Deferens and cord structures identified and preserved 2. Progrip mesh used for repair 3. Adequate hemostasis achieved  Description of procedure: The patient was taken to the operating room. A time-out was completed verifying correct patient, procedure, site, positioning, and implant(s) and/or special equipment prior to beginning this procedure. The left groin was prepped and draped in the usual sterile fashion. An incision was marked in a natural skin crease and planned to end near the pubic tubercle.  The skin crease incision was made with a knife and deepened through Scarpa's and Camper's fascia with electrocautery until the aponeurosis of the external oblique was encountered. This was cleaned and the external ring was exposed. Hemostasis was achieved in the wound. An incision was made in the midportion of the external oblique aponeurosis in the direction of its fibers. The ilioinguinal nerve was identified and protected throughout the dissection. Flaps of the external oblique were developed cephalad and inferiorly.  The cord was identified. It was gently dissected free at the pubic tubercle and encircled with a Penrose drain. Attention was directed to the anteromedial aspect of the cord, where a large indirect hernia sac was identified. The sac was carefully dissected free of the cord down to the level of the internal ring,  exercise suture-ligated using 2-0 silk and stump reduced back into abdominal cavity along with an adjacent cord lipoma. The vas and testicular vessels were identified and protected from harm.   Attention then turned to the floor of the canal, which was fairly intact. The Progrip mesh was inserted and secured to the pubic tubercle using interrupted 0 ethibond sutures.  The flap was secured to the mesh for additional security using 0 Ethibond.  Lateral aspect of the mesh was also secured to the Cooper's ligament and transversalis fascia for additional security.  Care was taken to assure that the mesh was placed flat against the floor and wrapped loosely around the cord structures.  Hemostasis was again checked. The Penrose drain was removed. Exparel infused as an ilioinguinal block.  The external oblique aponeurosis was closed with a running suture of 2-0 Vicryl, taking care not to catch the ilioinguinal nerve in the suture line. Scarpa's fascia was closed with interrupted 3-0 Vicryl.  The deep dermal layer closed with interrupted 3-0 Vicryl.  The skin was closed with a subcuticular stitch of Monocryl 4-0. Dermabond was applied.  The testis was gently pulled down into its anatomic position in the scrotum.  The patient tolerated the procedure well and was taken to the postanesthesia care unit in stable condition. Sponge and instrument count correct at end of procedure.

## 2021-08-21 NOTE — Anesthesia Procedure Notes (Signed)
Procedure Name: LMA Insertion Date/Time: 08/21/2021 10:44 AM  Performed by: Biagio Borg, CRNAPre-anesthesia Checklist: Patient identified, Emergency Drugs available, Suction available and Patient being monitored Patient Re-evaluated:Patient Re-evaluated prior to induction Oxygen Delivery Method: Circle system utilized Preoxygenation: Pre-oxygenation with 100% oxygen Induction Type: IV induction Ventilation: Mask ventilation without difficulty LMA: LMA inserted LMA Size: 4.5 Tube type: Oral Number of attempts: 1 Placement Confirmation: positive ETCO2 and breath sounds checked- equal and bilateral Tube secured with: Tape Dental Injury: Teeth and Oropharynx as per pre-operative assessment

## 2021-08-21 NOTE — Interval H&P Note (Signed)
History and Physical Interval Note:  08/21/2021 10:16 AM  Frank Johnson  has presented today for surgery, with the diagnosis of K40.91 inguinal hernia, recurent.  The various methods of treatment have been discussed with the patient and family. After consideration of risks, benefits and other options for treatment, the patient has consented to  Procedure(s): HERNIA REPAIR INGUINAL ADULT (Left) as a surgical intervention.  The patient's history has been reviewed, patient examined, no change in status, stable for surgery.  I have reviewed the patient's chart and labs.  Questions were answered to the patient's satisfaction.     Eloy Fehl Lysle Pearl

## 2021-08-21 NOTE — Transfer of Care (Signed)
Immediate Anesthesia Transfer of Care Note  Patient: Natanel Snavely  Procedure(s) Performed: HERNIA REPAIR INGUINAL ADULT (Left: Groin)  Patient Location: PACU  Anesthesia Type:General  Level of Consciousness: drowsy  Airway & Oxygen Therapy: Patient Spontanous Breathing and Patient connected to nasal cannula oxygen  Post-op Assessment: Report given to RN and Post -op Vital signs reviewed and stable  Post vital signs: Reviewed and stable  Last Vitals:  Vitals Value Taken Time  BP 94/80 08/21/21 1236  Temp 36.6 C 08/21/21 1236  Pulse 74 08/21/21 1239  Resp 17 08/21/21 1239  SpO2 97 % 08/21/21 1239  Vitals shown include unvalidated device data.  Last Pain:  Vitals:   08/21/21 0855  TempSrc: Temporal  PainSc: 0-No pain      Patients Stated Pain Goal: 0 (11/73/56 7014)  Complications: No notable events documented.

## 2021-08-21 NOTE — Progress Notes (Signed)
Notified patient wife that patient is doing great in recovery, just very sleepy, and that we will let her know when he goes to the post op area.

## 2021-08-23 NOTE — Anesthesia Postprocedure Evaluation (Signed)
Anesthesia Post Note  Patient: Frank Johnson  Procedure(s) Performed: HERNIA REPAIR INGUINAL ADULT (Left: Groin)  Patient location during evaluation: PACU Anesthesia Type: General Level of consciousness: awake and alert Pain management: pain level controlled Vital Signs Assessment: post-procedure vital signs reviewed and stable Respiratory status: spontaneous breathing, nonlabored ventilation, respiratory function stable and patient connected to nasal cannula oxygen Cardiovascular status: blood pressure returned to baseline and stable Postop Assessment: no apparent nausea or vomiting Anesthetic complications: no   No notable events documented.   Last Vitals:  Vitals:   08/21/21 1400 08/21/21 1436  BP: 98/70 114/84  Pulse: (!) 56 (!) 55  Resp: 12 18  Temp:  (!) 36.1 C  SpO2: 95%     Last Pain:  Vitals:   08/22/21 1748  TempSrc:   PainSc: 0-No pain                 Martha Clan

## 2021-08-24 ENCOUNTER — Encounter: Payer: Self-pay | Admitting: Surgery

## 2021-11-26 NOTE — Therapy (Signed)
OUTPATIENT PHYSICAL THERAPY VESTIBULAR EVALUATION     Patient Name: Frank Johnson MRN: [id number] DOB:[birth year], 66 y.o., male Today's Date: 11/27/2021   PT End of Session - 11/27/21 1121     Visit Number 1    Number of Visits 1    Date for PT Re-Evaluation 11/27/21    Authorization Type eval only    PT Start Time 0907    PT Stop Time 0951    PT Time Calculation (min) 44 min    Equipment Utilized During Treatment Gait belt    Activity Tolerance Patient tolerated treatment well    Behavior During Therapy WFL for tasks assessed/performed             Past Medical History:  Diagnosis Date   Anxiety    Arthritis    Bacterial meningitis 2022   Cancer ([place])    Chronic back pain    CKD (chronic kidney disease), stage III (HCC)    Complication of anesthesia    hard to wake up after brain surgery and had to be admitted   ED (erectile dysfunction)    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[place])    Gout    History of kidney stones    History of pulmonary embolus (PE) 02/2021   HTN (hypertension)    Insomnia    Kidney stone    Left inguinal hernia    Low HDL (under 40)    Neutropenia (HCC)    Overweight    Polycythemia    Pseudophakia of both eyes    Sleep apnea    Uses cpap every night   Past Surgical History:  Procedure Laterality Date   BRAIN SURGERY     with Dr Lacinda Axon at [facility] of 4th ventricle, WHO grade 2   cataracts Bilateral    COLONOSCOPY     COLONOSCOPY WITH PROPOFOL N/A 09/24/2020   Procedure: COLONOSCOPY WITH PROPOFOL;  Surgeon: Toledo, Benay Pike, MD;  Location: [facility] ENDOSCOPY;  Service: Gastroenterology;  Laterality: N/A;   EYE SURGERY     HERNIA REPAIR Left    INGUINAL HERNIA REPAIR Left 08/21/2021   Procedure: HERNIA REPAIR INGUINAL ADULT;  Surgeon: Benjamine Sprague, DO;  Location: [facility] ORS;  Service: General;  Laterality: Left;   LITHOTRIPSY     RETINAL DETACHMENT SURGERY Bilateral 2020   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   Patient Active Problem List    Diagnosis Date Noted   Pseudophakia of right eye 03/28/2019   Combined forms of age-related cataract of both eyes 03/08/2019   Primary insomnia 02/26/2019   Hernia, inguinal, left 10/03/2012   History of urinary stone 10/03/2012    PCP: Idelle Crouch, MD REFERRING PROVIDER: Holzmacher, Mia Creek, NP  REFERRING DIAG: 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CMS-HCC) (C71.9)  THERAPY DIAG:  Dizziness and giddiness  ONSET DATE: Symptom onset August 2022  Rationale for Evaluation and Treatment Rehabilitation  SUBJECTIVE:   SUBJECTIVE STATEMENT: Pt presents to PT evaluation ambulating independently for concerns of wooziness. Pt accompanied by: significant other and family member  PERTINENT HISTORY:   Pt is a pleasant 66 yo male referred for dizziness. Pt ambulating without AD, accompanied by his family today. Pt wearing sunglasses due to light sensitivity that he reports he has had prior to onset of current issue for many years. 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 Initially pt diagnosed with B12 deficiency and received B12 shots, this improved his energy levels and he felt back to normal. About a  month later pt had an MRI and was then immediately referred to the cancer surgeon and had surgery that day (see below). Pt later on developed meningitis and ventriculitis and had extended hospital stay where he received PT.  Pt can still feel a little woozy at times. He states "I'm not really dizzy," he feels his balance is pretty good, but still avoids certain activities such as running, stating "I don't know if it's good for me." He has had 1 fall in last six months when he was running over uneven ground into a pen and slipped, but did not injure himself. Otherwise he reports no issues with stumbles or falls, and often walks on uneven surfaces, climbs ladders, and has no issues. Pt denies symptoms of spinning, lightheadedness, difficulty with quick head  turns. He reports he did have maneuver performed to treat his inner ear a while back, however, this preceded diagnosis of meningitis and has not had any vertigo symptoms. At times, looking up or standing up from a bending position can make him feel a little woozy. Pt does not think he needs PT at this time since he has returned to majority of activities, and states that he thinks this is the best he is going to be. He is currently very active and tries to remain as active as possible.  Per chart pt presented August 2022 with headache and dizziness. MRI of brain performed 09/22/2020 and again 12/10/2020 revealing 4th ventricular heterogeneously enhancing mass. 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  Neurosurgery, taken to OR 12/11/2020 for posterior fossa tumor resection, pathology consistent with ependymoma WHO grade 2. 02/23/2021 pt hospitalized with meningitis/ventriculitis. Pt had surgery 02/23/2021 right frontal VP shunt with a Codman Hakim Programmable Valve currently valve set at 200.  PMH via chart significant for cancer, Ependymoma of brian ([place]), anxiety, arthritis, bacterial meningitis (2022), chronic back pain, CKD stage III, gout, hx of kidney stones, hx of pulmonary embolus (02/2021), HTN, insomnia, L inguinal hernia, polycythemia, neutropenia, pseudophakia of both eyes, sleep apnea has cpap.  PAIN:  Are you having pain? No  PRECAUTIONS: None  WEIGHT BEARING RESTRICTIONS: No  FALLS: Has patient fallen in last 6 months? Yes. Number of falls 1   LIVING ENVIRONMENT: Lives with: lives with their family Lives in: House/apartment Stairs: Yes: External: 1-2 steps; on right going up and on left going up   PLOF: Independent  PATIENT GOALS: I think I'm as good as I'm going to be  OBJECTIVE:   DIAGNOSTIC FINDINGS: via chart  11/11/2021 MRI brain with and without contrast " IMPRESSION:  Stable appearance of prior suboccipital craniotomy and subjacent resection.   Irregularly enhancing nodularity at the inferior margin of the fourth  ventricle is unchanged as compared to the prior studies."  MRI total spine incl MRI C T L Spine w wo contrast: "IMPRESSION:  1.  No evidence of leptomeningeal metastatic disease involving the spine.  2.  See same day separately dictated brain MRI for intracranial findings. "  09/22/2020 MR BRAIN W CONTRAST "IMPRESSION: 1. The fourth ventricular mass seen on same day MRI demonstrates heterogeneous enhancement. No other enhancing lesions identified. Differential considerations include a choroid plexus tumor (papilloma or carcinoma), subependymoma, and ependymoma. Metastasis is possible, but thought unlikely given no other enhancing lesions identified and no known primary malignancy on chart review. 2. MRI imaging of the entire spine with contrast is recommended to evaluate for additional lesions/drop-metastases."  09/22/2020 MR BRAIN WO CONTRAST:"IMPRESSION: Heterogeneous mass within the inferior  fourth ventricle. No hydrocephalus at this time. Postcontrast imaging is recommended for further characterization."   Pt has extensive imaging history, please refer to chart for full details.   COGNITION: Overall cognitive status: Within functional limits for tasks assessed   SENSATION: WFL per pt no n/t    POSTURE:  Slightly flexed at trunk in standing, however, no major impairments   STRENGTH: grossly 5/5 BLE   GAIT: Gait mechanics WFL, ambulates without AD   FUNCTIONAL TESTS:  Dynamic Gait Index: 22/24  PATIENT SURVEYS:  Eval only  VESTIBULAR ASSESSMENT: SYMPTOM BEHAVIOR:  Subjective history: see above    Type of dizziness:  woozy  Frequency: sometimes with looking up, standing up from bending   Progression of symptoms: better but still present  OCULOMOTOR EXAM:  Ocular Alignment: normal  Ocular ROM: No Limitations  Spontaneous Nystagmus: absent  Gaze-Induced Nystagmus: absent  Smooth Pursuits:   2-4 saccadic intrusions, otherwise fluid movement  Saccades: intact  Convergence/Divergence: WNL   Slow VOR - WNL   PATIENT EDUCATION: Education details: exam findings, plan, HEP Person educated: Patient Education method: Explanation, Demonstration, Verbal cues, and Handouts Education comprehension: verbalized understanding and returned demonstration  HOME EXERCISE PROGRAM and Treatment:  Instructed pt in habituation technique with looking up, bending and standing. Instructed pt to rest if symptoms increase 2 points or more on 0-10 scale regarding wooziness.  Instructed in progressive walking program - work your way up to 20-30 minutes 2-3x/week, quick pace  Issued HEP, pt demo'd correct technique in session. Instructed to perform on firm surface near wall. Access Code: [email or web address] URL: [email or web address] Date: 11/27/2021 Prepared by: Ricard Dillon  Exercises - Walking with Head Rotation  - 1 x daily - 7 x weekly - 3 sets - 10 reps - Gait with Head Nods and Turns on Grass  - 1 x daily - 7 x weekly - 3 sets - 10 reps  GOALS: Goals reviewed with patient? Yes  SHORT TERM GOALS: Target date:  11/27/2021    Patient will exhibit correct understanding of home exercise program to improve mobility for better functional independence and ease with ADLs. Goal status: GOAL MET, HEP Issued, see above   ASSESSMENT:  CLINICAL IMPRESSION: Patient is a pleasant 66 y.o. male who was referred to PT for dizziness. Upon exam, pt reports he does not have dizziness, but occasional wooziness with looking up and bending and standing. He denies vertigo, difficulty with quick head turns,  and presents with unremarkable oculomotor exam, except for 2-4 saccadic intrusions with smooth pursuits. No movements in session recreated his symptoms. Pt with 22/24 on DGI, exhibiting only minor changes in BOS when ambulating with head turns but no unsteadiness, wooziness or dizziness. Issued HEP to  address this. LE grossly 5/5 B. Pt did report his wooziness has improved somewhat over time. He has returned to work and former high-level activities. PT also discussed habituation activity to improve bending>standing and looking up motions (see above). Recommended he review his medications with his physician at upcoming appointment to determine if this could be a contributing factor. The pt does not require vestibular physical therapy at this time. PT did recommend pt seek new referral if symptoms worsen in the future.   OBJECTIVE IMPAIRMENTS: Abnormal gait and hypomobility.   ACTIVITY LIMITATIONS: bending and occasionally issues with bending and looking up due to wooziness, but no unsteadiness  PARTICIPATION LIMITATIONS:  not currently limited  PERSONAL FACTORS: Age and 3+ comorbidities: PMH via chart significant for cancer, Ependymoma of brian (  [place]), anxiety, arthritis, bacterial meningitis (2022), chronic back pain, CKD stage III, gout, hx of kidney stones, hx of pulmonary embolus (02/2021), HTN, insomnia, L inguinal hernia, polycythemia, neutropenia, pseudophakia of both eyes, sleep apnea has cpap.  are also affecting patient's functional outcome.   REHAB POTENTIAL: Good  CLINICAL DECISION MAKING: Evolving/moderate complexity  EVALUATION COMPLEXITY: Low   PLAN: PT FREQUENCY: one time visit  PT DURATION: other: one time visit  PLANNED INTERVENTIONS: Therapeutic exercises, Therapeutic activity, Neuromuscular re-education, Balance training, and Gait training  PLAN FOR NEXT SESSION: one time visit   Zollie Pee, PT 11/27/2021, 11:50 AM

## 2021-11-27 ENCOUNTER — Ambulatory Visit: Payer: Medicare Other | Attending: Adult Health Nurse Practitioner

## 2021-11-27 DIAGNOSIS — R42 Dizziness and giddiness: Secondary | ICD-10-CM | POA: Diagnosis present

## 2021-11-30 ENCOUNTER — Ambulatory Visit: Payer: Medicare Other

## 2021-12-04 ENCOUNTER — Ambulatory Visit: Payer: No Typology Code available for payment source

## 2021-12-07 ENCOUNTER — Ambulatory Visit: Payer: No Typology Code available for payment source | Admitting: Physical Therapy

## 2021-12-09 ENCOUNTER — Ambulatory Visit: Payer: No Typology Code available for payment source

## 2021-12-14 ENCOUNTER — Ambulatory Visit: Payer: No Typology Code available for payment source

## 2021-12-16 ENCOUNTER — Ambulatory Visit: Payer: No Typology Code available for payment source

## 2021-12-21 ENCOUNTER — Ambulatory Visit: Payer: No Typology Code available for payment source

## 2021-12-23 ENCOUNTER — Ambulatory Visit: Payer: No Typology Code available for payment source

## 2021-12-31 ENCOUNTER — Ambulatory Visit: Payer: No Typology Code available for payment source

## 2022-01-06 ENCOUNTER — Ambulatory Visit: Payer: No Typology Code available for payment source

## 2022-01-14 ENCOUNTER — Ambulatory Visit: Payer: No Typology Code available for payment source

## 2022-01-15 ENCOUNTER — Ambulatory Visit: Payer: No Typology Code available for payment source

## 2022-01-18 ENCOUNTER — Ambulatory Visit: Payer: No Typology Code available for payment source

## 2022-01-21 ENCOUNTER — Ambulatory Visit: Payer: No Typology Code available for payment source

## 2022-01-25 ENCOUNTER — Ambulatory Visit: Payer: No Typology Code available for payment source

## 2022-01-27 ENCOUNTER — Ambulatory Visit: Payer: No Typology Code available for payment source

## 2022-02-01 ENCOUNTER — Ambulatory Visit: Payer: No Typology Code available for payment source

## 2022-02-03 ENCOUNTER — Ambulatory Visit: Payer: No Typology Code available for payment source

## 2022-02-10 ENCOUNTER — Ambulatory Visit: Payer: No Typology Code available for payment source

## 2022-02-12 ENCOUNTER — Ambulatory Visit: Payer: No Typology Code available for payment source

## 2022-02-17 ENCOUNTER — Ambulatory Visit: Payer: No Typology Code available for payment source

## 2022-02-19 ENCOUNTER — Ambulatory Visit: Payer: No Typology Code available for payment source

## 2022-02-22 ENCOUNTER — Ambulatory Visit: Payer: No Typology Code available for payment source

## 2022-02-25 ENCOUNTER — Ambulatory Visit: Payer: No Typology Code available for payment source

## 2023-04-08 ENCOUNTER — Ambulatory Visit: Payer: Medicare Other | Admitting: Sleep Medicine

## 2023-04-08 ENCOUNTER — Encounter: Payer: Self-pay | Admitting: Sleep Medicine

## 2023-04-08 VITALS — BP 124/88 | HR 71 | Temp 97.8°F | Ht 72.0 in | Wt 222.0 lb

## 2023-04-08 DIAGNOSIS — E66811 Obesity, class 1: Secondary | ICD-10-CM

## 2023-04-08 DIAGNOSIS — I10 Essential (primary) hypertension: Secondary | ICD-10-CM

## 2023-04-08 DIAGNOSIS — G4733 Obstructive sleep apnea (adult) (pediatric): Secondary | ICD-10-CM

## 2023-04-08 NOTE — Progress Notes (Signed)
Name:Frank Johnson MRN: [id number] DOB: 1955-06-12   CHIEF COMPLAINT:  ESTABLISH CARE FOR OSA   HISTORY OF PRESENT ILLNESS:  Frank Johnson is a 68 y.o. w/ a h/o anxiety, HTN, atrial fibrillation and obesity who presents to establish care for OSA. Patient was initially diagnosed with severe OSA (RDI 65, O2 nadir 83%) and was subsequently started on CPAP therapy. Reports using CPAP therapy every night, which is confirmed by compliance data. He is currently on CPAP therapy set to 7 cm H2O. He is currently using the comfort Gel nasal mask. Reports intermittent snoring with CPAP therapy. Reports feeling refreshed upon awakening with CPAP therapy.   Reports nocturnal awakenings due to unclear reasons, and often has difficulty falling back to sleep. Denies any significant weight changes. Admits to dry mouth. Denies morning headaches, night sweats, RLS symptoms or dream enactment. Reports a family history of sleep apnea. Denies drowsy driving. Drinks 1 glass of tea or soda daily, denies alcohol, tobacco or illicit drug use.   Bedtime 10 pm Sleep onset 30 mins with Ambien Rise time 5-6 am   EPWORTH SLEEP SCORE 7     No data to display         PAST MEDICAL HISTORY :   has a past medical history of Anxiety, Arthritis, Bacterial meningitis (2022), Cancer (HCC), Chronic back pain, CKD (chronic kidney disease), stage III (HCC), Complication of anesthesia, ED (erectile dysfunction), 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HCC), Gout, History of kidney stones, History of pulmonary embolus (PE) (02/2021), HTN (hypertension), Insomnia, Kidney stone, Left inguinal hernia, Low HDL (under 40), Neutropenia (HCC), Overweight, Polycythemia, Pseudophakia of both eyes, and Sleep apnea.  has a past surgical history that includes Retinal detachment surgery (Bilateral, 2020); Eye surgery; Colonoscopy; Lithotripsy; cataracts (Bilateral); Colonoscopy with propofol (N/A, 09/24/2020); Brain surgery; Ventriculoperitoneal  shunt (02/2021); Hernia repair (Left); and Inguinal hernia repair (Left, 08/21/2021). Prior to Admission medications   Medication Sig Start Date End Date Taking? Authorizing Provider  allopurinol (ZYLOPRIM) 300 MG tablet Take 1 tablet (300 mg total) by mouth daily. Patient taking differently: Take 300 mg by mouth as needed. 05/17/19   Joni Reining, PA-C  ALPRAZolam Prudy Feeler) 0.5 MG tablet TAKE 1 TABLET BY MOUTH DAILY. Patient taking differently: Take 0.5 mg by mouth at bedtime. 02/22/20   Joni Reining, PA-C  apixaban (ELIQUIS) 5 MG TABS tablet Take 5 mg by mouth 2 (two) times daily.    [provider]  cyanocobalamin 1000 MCG tablet Take 1,000 mcg by mouth daily.    [provider]  HYDROcodone-acetaminophen (NORCO) 5-325 MG tablet Take 1 tablet by mouth every 6 (six) hours as needed for up to 6 doses for moderate pain. 08/21/21   Tonna Boehringer, Isami, DO  ibuprofen (ADVIL) 800 MG tablet Take 1 tablet (800 mg total) by mouth every 8 (eight) hours as needed for mild pain or moderate pain. 08/21/21   Tonna Boehringer, Isami, DO  lisinopril (ZESTRIL) 20 MG tablet Take 1 tablet (20 mg total) by mouth daily. Patient taking differently: Take 20 mg by mouth every morning. 02/20/20   Viviano Simas, FNP  meloxicam (MOBIC) 15 MG tablet TAKE ONE TABLET EVERY DAY AS NEEDED Patient taking differently: Take 15 mg by mouth as needed. 08/31/18   Jamelle Haring, MD  VITAMIN D PO Take 1 tablet by mouth daily.    [provider]  zolpidem (AMBIEN CR) 12.5 MG CR tablet 1/2 tab - 1 tab po hs prn Patient taking differently: 12.5  mg at bedtime. 1/2 tab - 1 tab po hs prn 02/06/20   Joni Reining, PA-C   No Known Allergies  FAMILY HISTORY:  family history is not on file. SOCIAL HISTORY:  reports that he has never smoked. He has never used smokeless tobacco. He reports that he does not drink alcohol and does not use drugs.   Review of Systems:  Gen:  Denies  fever, sweats, chills weight loss  HEENT:  Denies blurred vision, double vision, ear pain, eye pain, hearing loss, nose bleeds, sore throat Cardiac:  No dizziness, chest pain or heaviness, chest tightness,edema, No JVD Resp:   No cough, -sputum production, -shortness of breath,-wheezing, -hemoptysis,  Gi: Denies swallowing difficulty, stomach pain, nausea or vomiting, diarrhea, constipation, bowel incontinence Gu:  Denies bladder incontinence, burning urine Ext:   Denies Joint pain, stiffness or swelling Skin: Denies  skin rash, easy bruising or bleeding or hives Endoc:  Denies polyuria, polydipsia , polyphagia or weight change Psych:   Denies depression, insomnia or hallucinations  Other:  All other systems negative  VITAL SIGNS: BP 124/88 (BP Location: Left Arm, Patient Position: Sitting, Cuff Size: Normal)   Pulse 71   Temp 97.8 F (36.6 C) (Temporal)   Ht 6' (1.829 m)   Wt 222 lb (100.7 kg)   SpO2 96%   BMI 30.11 kg/m     Physical Examination:   General Appearance: No distress  EYES PERRLA, EOM intact.   NECK Supple, No JVD Pulmonary: normal breath sounds, No wheezing.  CardiovascularNormal S1,S2.  No m/r/g.   Abdomen: Benign, Soft, non-tender. Skin:   warm, no rashes, no ecchymosis  Extremities: normal, no cyanosis, clubbing. Neuro:without focal findings,  speech normal  PSYCHIATRIC: Mood, affect within normal limits.   ASSESSMENT AND PLAN  OSA Patient is using and benefiting from CPAP therapy. Due to snoring with CPAP therapy, changed pressure to auto 4-16 cm H2O. Completed order for new CPAP supplies. Discussed the consequences of untreated sleep apnea. Advised not to drive drowsy for safety of patient and others. Will follow up in 6 months to review CPAP efficacy and compliance data.     HTN Stable, on current management. Following with PCP.   Insomnia Discussed weaning off of Ambien and trying Trazodone. Patient will discuss further with his PCP.   Patient  satisfied with Plan of action and management.  All questions answered  Follow up in 6 months to review CPAP efficacy and compliance data.   I spent a total of 45 minutes reviewing chart data, face-to-face evaluation with the patient, counseling and coordination of care as detailed above.    Tempie Hoist, M.D.  Sleep Medicine [facility]

## 2023-04-08 NOTE — Patient Instructions (Addendum)
Changed CPAP pressure setting to 4-16 cm H2O. Ordered new mask and supplies. Increased humidity level to 5. Discussed weaning off of Ambien and trying Trazodone.   Recommend reading "Say Goodnight to Insomnia".

## 2024-03-16 ENCOUNTER — Encounter: Payer: Self-pay | Admitting: Urology

## 2024-03-16 ENCOUNTER — Ambulatory Visit (INDEPENDENT_AMBULATORY_CARE_PROVIDER_SITE_OTHER): Admitting: Urology

## 2024-03-16 VITALS — BP 133/81 | HR 79 | Ht 72.0 in | Wt 225.0 lb

## 2024-03-16 DIAGNOSIS — N529 Male erectile dysfunction, unspecified: Secondary | ICD-10-CM | POA: Diagnosis not present
